# Patient Record
Sex: Female | Born: 1985 | Race: Black or African American | Hispanic: No | Marital: Married | Smoking: Never smoker
Health system: Southern US, Community
[De-identification: ages and names within clinical notes are randomized; demographics above are authoritative.]

## PROBLEM LIST (undated history)

## (undated) DIAGNOSIS — Z973 Presence of spectacles and contact lenses: Secondary | ICD-10-CM

## (undated) DIAGNOSIS — K760 Fatty (change of) liver, not elsewhere classified: Secondary | ICD-10-CM

## (undated) DIAGNOSIS — Z8489 Family history of other specified conditions: Secondary | ICD-10-CM

## (undated) DIAGNOSIS — J3089 Other allergic rhinitis: Secondary | ICD-10-CM

## (undated) DIAGNOSIS — J40 Bronchitis, not specified as acute or chronic: Secondary | ICD-10-CM

## (undated) DIAGNOSIS — M67819 Other specified disorders of synovium and tendon, unspecified shoulder: Secondary | ICD-10-CM

## (undated) DIAGNOSIS — J449 Chronic obstructive pulmonary disease, unspecified: Secondary | ICD-10-CM

## (undated) DIAGNOSIS — M19011 Primary osteoarthritis, right shoulder: Secondary | ICD-10-CM

## (undated) DIAGNOSIS — Z87898 Personal history of other specified conditions: Secondary | ICD-10-CM

## (undated) DIAGNOSIS — F209 Schizophrenia, unspecified: Secondary | ICD-10-CM

## (undated) DIAGNOSIS — K529 Noninfective gastroenteritis and colitis, unspecified: Secondary | ICD-10-CM

## (undated) DIAGNOSIS — F32A Depression, unspecified: Secondary | ICD-10-CM

## (undated) DIAGNOSIS — F4310 Post-traumatic stress disorder, unspecified: Secondary | ICD-10-CM

## (undated) DIAGNOSIS — J45909 Unspecified asthma, uncomplicated: Secondary | ICD-10-CM

## (undated) DIAGNOSIS — F329 Major depressive disorder, single episode, unspecified: Secondary | ICD-10-CM

## (undated) HISTORY — PX: COLONOSCOPY: SHX174

## (undated) HISTORY — PX: CHOLECYSTECTOMY: SHX55

## (undated) HISTORY — PX: WISDOM TOOTH EXTRACTION: SHX21

---

## 2008-02-03 ENCOUNTER — Emergency Department (HOSPITAL_COMMUNITY): Admission: EM | Admit: 2008-02-03 | Discharge: 2008-02-03 | Payer: Self-pay | Admitting: Emergency Medicine
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
## 2013-02-01 DIAGNOSIS — N979 Female infertility, unspecified: Secondary | ICD-10-CM | POA: Insufficient documentation

## 2013-08-09 DIAGNOSIS — F603 Borderline personality disorder: Secondary | ICD-10-CM | POA: Insufficient documentation

## 2013-08-09 DIAGNOSIS — F4481 Dissociative identity disorder: Secondary | ICD-10-CM | POA: Insufficient documentation

## 2014-10-01 DIAGNOSIS — F4310 Post-traumatic stress disorder, unspecified: Secondary | ICD-10-CM | POA: Insufficient documentation

## 2017-02-14 DIAGNOSIS — F250 Schizoaffective disorder, bipolar type: Secondary | ICD-10-CM | POA: Insufficient documentation

## 2017-08-17 DIAGNOSIS — K760 Fatty (change of) liver, not elsewhere classified: Secondary | ICD-10-CM | POA: Insufficient documentation

## 2018-11-08 DIAGNOSIS — F332 Major depressive disorder, recurrent severe without psychotic features: Secondary | ICD-10-CM | POA: Insufficient documentation

## 2018-11-08 DIAGNOSIS — L680 Hirsutism: Secondary | ICD-10-CM | POA: Insufficient documentation

## 2018-11-18 DIAGNOSIS — B159 Hepatitis A without hepatic coma: Secondary | ICD-10-CM | POA: Insufficient documentation

## 2019-01-23 ENCOUNTER — Other Ambulatory Visit: Payer: Self-pay | Admitting: Orthopedic Surgery

## 2019-01-30 ENCOUNTER — Encounter (HOSPITAL_COMMUNITY): Payer: Self-pay | Admitting: *Deleted

## 2019-01-30 ENCOUNTER — Other Ambulatory Visit: Payer: Self-pay

## 2019-01-30 NOTE — Progress Notes (Signed)
Pt denies any acute pulmonary issues. Pt denies chest pain and being under the care of a cardiologist. Pt denies having a stress test and cardiac cath but stated that an echo and chest x ray were done last year; records requested. Pt denies having an EKG within the last year. Pt denies recent labs. Pt made aware to stop taking Aspirin ( unless advised otherwise by surgeon), vitamins, fish oil and herbal medications. Do not take any NSAIDs ie: Ibuprofen, Advil, Naproxen (Aleve), Motrin, BC and Goody Powder or any medication containing Aspirin. Pt verbalized understanding of all pre-op instructions.

## 2019-01-31 ENCOUNTER — Encounter (HOSPITAL_COMMUNITY): Payer: Self-pay

## 2019-01-31 ENCOUNTER — Ambulatory Visit (HOSPITAL_COMMUNITY)
Admission: RE | Admit: 2019-01-31 | Discharge: 2019-01-31 | Disposition: A | Discharge status: Home / Self Care | Payer: No Typology Code available for payment source | Attending: Orthopedic Surgery | Admitting: Orthopedic Surgery

## 2019-01-31 ENCOUNTER — Ambulatory Visit (HOSPITAL_COMMUNITY): Payer: No Typology Code available for payment source | Admitting: Anesthesiology

## 2019-01-31 ENCOUNTER — Encounter (HOSPITAL_COMMUNITY)
Admission: RE | Disposition: A | Discharge status: Home / Self Care | Payer: Self-pay | Source: Home / Self Care | Attending: Orthopedic Surgery

## 2019-01-31 DIAGNOSIS — F4310 Post-traumatic stress disorder, unspecified: Secondary | ICD-10-CM | POA: Diagnosis not present

## 2019-01-31 DIAGNOSIS — Z9049 Acquired absence of other specified parts of digestive tract: Secondary | ICD-10-CM | POA: Diagnosis not present

## 2019-01-31 DIAGNOSIS — Z6841 Body Mass Index (BMI) 40.0 and over, adult: Secondary | ICD-10-CM | POA: Insufficient documentation

## 2019-01-31 DIAGNOSIS — Z7951 Long term (current) use of inhaled steroids: Secondary | ICD-10-CM | POA: Insufficient documentation

## 2019-01-31 DIAGNOSIS — M19011 Primary osteoarthritis, right shoulder: Secondary | ICD-10-CM | POA: Diagnosis present

## 2019-01-31 DIAGNOSIS — Z885 Allergy status to narcotic agent status: Secondary | ICD-10-CM | POA: Insufficient documentation

## 2019-01-31 DIAGNOSIS — M659 Synovitis and tenosynovitis, unspecified: Secondary | ICD-10-CM | POA: Diagnosis not present

## 2019-01-31 DIAGNOSIS — M719 Bursopathy, unspecified: Secondary | ICD-10-CM | POA: Diagnosis not present

## 2019-01-31 DIAGNOSIS — K760 Fatty (change of) liver, not elsewhere classified: Secondary | ICD-10-CM | POA: Diagnosis not present

## 2019-01-31 DIAGNOSIS — Z91041 Radiographic dye allergy status: Secondary | ICD-10-CM | POA: Insufficient documentation

## 2019-01-31 DIAGNOSIS — M7541 Impingement syndrome of right shoulder: Secondary | ICD-10-CM | POA: Diagnosis not present

## 2019-01-31 DIAGNOSIS — Z880 Allergy status to penicillin: Secondary | ICD-10-CM | POA: Diagnosis not present

## 2019-01-31 DIAGNOSIS — Y990 Civilian activity done for income or pay: Secondary | ICD-10-CM | POA: Diagnosis not present

## 2019-01-31 DIAGNOSIS — F209 Schizophrenia, unspecified: Secondary | ICD-10-CM | POA: Diagnosis not present

## 2019-01-31 DIAGNOSIS — J449 Chronic obstructive pulmonary disease, unspecified: Secondary | ICD-10-CM | POA: Insufficient documentation

## 2019-01-31 DIAGNOSIS — F329 Major depressive disorder, single episode, unspecified: Secondary | ICD-10-CM | POA: Diagnosis not present

## 2019-01-31 DIAGNOSIS — K529 Noninfective gastroenteritis and colitis, unspecified: Secondary | ICD-10-CM | POA: Insufficient documentation

## 2019-01-31 DIAGNOSIS — M24811 Other specific joint derangements of right shoulder, not elsewhere classified: Secondary | ICD-10-CM | POA: Diagnosis not present

## 2019-01-31 DIAGNOSIS — Z888 Allergy status to other drugs, medicaments and biological substances status: Secondary | ICD-10-CM | POA: Insufficient documentation

## 2019-01-31 DIAGNOSIS — K219 Gastro-esophageal reflux disease without esophagitis: Secondary | ICD-10-CM | POA: Insufficient documentation

## 2019-01-31 DIAGNOSIS — Z91018 Allergy to other foods: Secondary | ICD-10-CM | POA: Insufficient documentation

## 2019-01-31 DIAGNOSIS — Z79899 Other long term (current) drug therapy: Secondary | ICD-10-CM | POA: Insufficient documentation

## 2019-01-31 HISTORY — DX: Unspecified asthma, uncomplicated: J45.909

## 2019-01-31 HISTORY — DX: Depression, unspecified: F32.A

## 2019-01-31 HISTORY — DX: Schizophrenia, unspecified: F20.9

## 2019-01-31 HISTORY — DX: Primary osteoarthritis, right shoulder: M19.011

## 2019-01-31 HISTORY — DX: Other allergic rhinitis: J30.89

## 2019-01-31 HISTORY — DX: Noninfective gastroenteritis and colitis, unspecified: K52.9

## 2019-01-31 HISTORY — PX: SHOULDER ARTHROSCOPY: SHX128

## 2019-01-31 HISTORY — DX: Post-traumatic stress disorder, unspecified: F43.10

## 2019-01-31 HISTORY — DX: Other specified disorders of synovium and tendon, unspecified shoulder: M67.819

## 2019-01-31 HISTORY — DX: Major depressive disorder, single episode, unspecified: F32.9

## 2019-01-31 HISTORY — DX: Bronchitis, not specified as acute or chronic: J40

## 2019-01-31 HISTORY — DX: Presence of spectacles and contact lenses: Z97.3

## 2019-01-31 HISTORY — DX: Morbid (severe) obesity due to excess calories: E66.01

## 2019-01-31 HISTORY — DX: Personal history of other specified conditions: Z87.898

## 2019-01-31 HISTORY — DX: Family history of other specified conditions: Z84.89

## 2019-01-31 HISTORY — DX: Chronic obstructive pulmonary disease, unspecified: J44.9

## 2019-01-31 HISTORY — DX: Fatty (change of) liver, not elsewhere classified: K76.0

## 2019-01-31 LAB — CBC
HCT: 42.5 % (ref 36.0–46.0)
Hemoglobin: 13.8 g/dL (ref 12.0–15.0)
MCH: 30.2 pg (ref 26.0–34.0)
MCHC: 32.5 g/dL (ref 30.0–36.0)
MCV: 93 fL (ref 80.0–100.0)
NRBC: 0 % (ref 0.0–0.2)
Platelets: 234 10*3/uL (ref 150–400)
RBC: 4.57 MIL/uL (ref 3.87–5.11)
RDW: 13.4 % (ref 11.5–15.5)
WBC: 4.2 10*3/uL (ref 4.0–10.5)

## 2019-01-31 LAB — COMPREHENSIVE METABOLIC PANEL
ALBUMIN: 3.7 g/dL (ref 3.5–5.0)
ALT: 14 U/L (ref 0–44)
AST: 17 U/L (ref 15–41)
Alkaline Phosphatase: 51 U/L (ref 38–126)
Anion gap: 14 (ref 5–15)
BUN: 10 mg/dL (ref 6–20)
CO2: 18 mmol/L — ABNORMAL LOW (ref 22–32)
CREATININE: 0.79 mg/dL (ref 0.44–1.00)
Calcium: 9.1 mg/dL (ref 8.9–10.3)
Chloride: 107 mmol/L (ref 98–111)
GFR calc Af Amer: >60 mL/min (ref >60)
GFR calc non Af Amer: >60 mL/min (ref >60)
Glucose, Bld: 93 mg/dL (ref 70–99)
Potassium: 4.1 mmol/L (ref 3.5–5.1)
Sodium: 139 mmol/L (ref 135–145)
Total Bilirubin: 0.8 mg/dL (ref 0.3–1.2)
Total Protein: 6.8 g/dL (ref 6.5–8.1)

## 2019-01-31 LAB — POCT PREGNANCY, URINE: Preg Test, Ur: NEGATIVE

## 2019-01-31 SURGERY — ARTHROSCOPY, SHOULDER
Anesthesia: General | Site: Shoulder | Laterality: Right

## 2019-01-31 MED ORDER — DEXAMETHASONE SODIUM PHOSPHATE 10 MG/ML IJ SOLN
INTRAMUSCULAR | Status: AC
  Filled 2019-01-31: qty 3

## 2019-01-31 MED ORDER — BUPIVACAINE-EPINEPHRINE 0.5% -1:200000 IJ SOLN
INTRAMUSCULAR | Status: AC
  Filled 2019-01-31: qty 1

## 2019-01-31 MED ORDER — ONDANSETRON HCL 4 MG/2ML IJ SOLN
INTRAMUSCULAR | Status: AC
  Filled 2019-01-31: qty 6

## 2019-01-31 MED ORDER — SODIUM CHLORIDE 0.9 % IR SOLN
Status: DC | PRN
  Administered 2019-01-31: 3000 mL

## 2019-01-31 MED ORDER — DEXAMETHASONE SODIUM PHOSPHATE 10 MG/ML IJ SOLN
INTRAMUSCULAR | Status: DC | PRN
  Administered 2019-01-31: 10 mg via INTRAVENOUS

## 2019-01-31 MED ORDER — LIDOCAINE 2% (20 MG/ML) 5 ML SYRINGE
INTRAMUSCULAR | Status: AC
  Filled 2019-01-31: qty 5

## 2019-01-31 MED ORDER — FENTANYL CITRATE (PF) 100 MCG/2ML IJ SOLN: 25.0000 ug | INTRAMUSCULAR | Status: DC | PRN

## 2019-01-31 MED ORDER — BUPIVACAINE LIPOSOME 1.3 % IJ SUSP
INTRAMUSCULAR | Status: DC | PRN
  Administered 2019-01-31: 10 mL

## 2019-01-31 MED ORDER — MIDAZOLAM HCL 2 MG/2ML IJ SOLN
INTRAMUSCULAR | Status: AC
  Filled 2019-01-31: qty 2

## 2019-01-31 MED ORDER — ONDANSETRON HCL 4 MG PO TABS: 4.0000 mg | ORAL_TABLET | Freq: Four times a day (QID) | ORAL | Status: DC | PRN

## 2019-01-31 MED ORDER — ONDANSETRON HCL 4 MG/2ML IJ SOLN: 4.0000 mg | Freq: Four times a day (QID) | INTRAMUSCULAR | Status: DC | PRN

## 2019-01-31 MED ORDER — MIDAZOLAM HCL 5 MG/5ML IJ SOLN
INTRAMUSCULAR | Status: DC | PRN
  Administered 2019-01-31: 2 mg via INTRAVENOUS

## 2019-01-31 MED ORDER — FENTANYL CITRATE (PF) 250 MCG/5ML IJ SOLN
INTRAMUSCULAR | Status: AC
  Filled 2019-01-31: qty 5

## 2019-01-31 MED ORDER — PROPOFOL 10 MG/ML IV BOLUS
INTRAVENOUS | Status: DC | PRN
  Administered 2019-01-31: 200 mg via INTRAVENOUS

## 2019-01-31 MED ORDER — MIDAZOLAM HCL 2 MG/2ML IJ SOLN
2.0000 mg | Freq: Once | INTRAMUSCULAR | Status: AC
  Administered 2019-01-31: 2 mg via INTRAVENOUS

## 2019-01-31 MED ORDER — BUPIVACAINE HCL (PF) 0.5 % IJ SOLN
INTRAMUSCULAR | Status: DC | PRN
  Administered 2019-01-31: 15 mL via PERINEURAL

## 2019-01-31 MED ORDER — METOCLOPRAMIDE HCL 5 MG/ML IJ SOLN: 5.0000 mg | Freq: Three times a day (TID) | INTRAMUSCULAR | Status: DC | PRN

## 2019-01-31 MED ORDER — PHENYLEPHRINE HCL 10 MG/ML IJ SOLN
INTRAMUSCULAR | Status: DC | PRN
  Administered 2019-01-31 (×2): 120 ug via INTRAVENOUS

## 2019-01-31 MED ORDER — VANCOMYCIN HCL 10 G IV SOLR
1500.0000 mg | INTRAVENOUS | Status: AC
  Administered 2019-01-31: 1500 mg via INTRAVENOUS
  Filled 2019-01-31: qty 1500

## 2019-01-31 MED ORDER — OXYCODONE HCL 5 MG/5ML PO SOLN: 5.0000 mg | Freq: Once | ORAL | Status: DC | PRN

## 2019-01-31 MED ORDER — FENTANYL CITRATE (PF) 100 MCG/2ML IJ SOLN
50.0000 ug | Freq: Once | INTRAMUSCULAR | Status: AC
  Administered 2019-01-31: 50 ug via INTRAVENOUS

## 2019-01-31 MED ORDER — FENTANYL CITRATE (PF) 250 MCG/5ML IJ SOLN
INTRAMUSCULAR | Status: DC | PRN
  Administered 2019-01-31: 50 ug via INTRAVENOUS
  Administered 2019-01-31: 100 ug via INTRAVENOUS

## 2019-01-31 MED ORDER — ROCURONIUM BROMIDE 50 MG/5ML IV SOSY
PREFILLED_SYRINGE | INTRAVENOUS | Status: AC
  Filled 2019-01-31: qty 20

## 2019-01-31 MED ORDER — ONDANSETRON HCL 4 MG/2ML IJ SOLN
INTRAMUSCULAR | Status: DC | PRN
  Administered 2019-01-31: 4 mg via INTRAVENOUS

## 2019-01-31 MED ORDER — DEXAMETHASONE SODIUM PHOSPHATE 10 MG/ML IJ SOLN
INTRAMUSCULAR | Status: AC
  Filled 2019-01-31: qty 1

## 2019-01-31 MED ORDER — ONDANSETRON HCL 4 MG/2ML IJ SOLN
INTRAMUSCULAR | Status: AC
  Filled 2019-01-31: qty 2

## 2019-01-31 MED ORDER — PROPOFOL 10 MG/ML IV BOLUS
INTRAVENOUS | Status: AC
  Filled 2019-01-31: qty 20

## 2019-01-31 MED ORDER — OXYCODONE-ACETAMINOPHEN 5-325 MG PO TABS: 1.0000 | ORAL_TABLET | ORAL | Status: DC | PRN

## 2019-01-31 MED ORDER — MIDAZOLAM HCL 2 MG/2ML IJ SOLN
INTRAMUSCULAR | Status: AC
  Administered 2019-01-31: 2 mg via INTRAVENOUS
  Filled 2019-01-31: qty 2

## 2019-01-31 MED ORDER — SUGAMMADEX SODIUM 200 MG/2ML IV SOLN
INTRAVENOUS | Status: DC | PRN
  Administered 2019-01-31: 200 mg via INTRAVENOUS

## 2019-01-31 MED ORDER — SUGAMMADEX SODIUM 500 MG/5ML IV SOLN
INTRAVENOUS | Status: AC
  Filled 2019-01-31: qty 5

## 2019-01-31 MED ORDER — CHLORHEXIDINE GLUCONATE 4 % EX LIQD: 60.0000 mL | Freq: Once | CUTANEOUS | Status: DC

## 2019-01-31 MED ORDER — OXYCODONE-ACETAMINOPHEN 5-325 MG PO TABS: 1.0000 | ORAL_TABLET | ORAL | 0 refills | Status: DC | PRN

## 2019-01-31 MED ORDER — OXYCODONE HCL 5 MG PO TABS: 5.0000 mg | ORAL_TABLET | Freq: Once | ORAL | Status: DC | PRN

## 2019-01-31 MED ORDER — DIPHENHYDRAMINE HCL 50 MG/ML IJ SOLN
INTRAMUSCULAR | Status: AC
  Filled 2019-01-31: qty 1

## 2019-01-31 MED ORDER — ROCURONIUM BROMIDE 100 MG/10ML IV SOLN
INTRAVENOUS | Status: DC | PRN
  Administered 2019-01-31: 25 mg via INTRAVENOUS
  Administered 2019-01-31: 50 mg via INTRAVENOUS

## 2019-01-31 MED ORDER — METOCLOPRAMIDE HCL 5 MG PO TABS: 5.0000 mg | ORAL_TABLET | Freq: Three times a day (TID) | ORAL | Status: DC | PRN

## 2019-01-31 MED ORDER — SODIUM CHLORIDE 0.9 % IV SOLN
INTRAVENOUS | Status: DC | PRN
  Administered 2019-01-31: 25 ug/min via INTRAVENOUS

## 2019-01-31 MED ORDER — ONDANSETRON HCL 4 MG/2ML IJ SOLN: 4.0000 mg | Freq: Once | INTRAMUSCULAR | Status: DC | PRN

## 2019-01-31 MED ORDER — GLYCOPYRROLATE PF 0.2 MG/ML IJ SOSY
PREFILLED_SYRINGE | INTRAMUSCULAR | Status: AC
  Filled 2019-01-31: qty 1

## 2019-01-31 MED ORDER — ROCURONIUM BROMIDE 50 MG/5ML IV SOSY
PREFILLED_SYRINGE | INTRAVENOUS | Status: AC
  Filled 2019-01-31: qty 5

## 2019-01-31 MED ORDER — LACTATED RINGERS IV SOLN
INTRAVENOUS | Status: DC
  Administered 2019-01-31 (×2): via INTRAVENOUS

## 2019-01-31 MED ORDER — FENTANYL CITRATE (PF) 100 MCG/2ML IJ SOLN
INTRAMUSCULAR | Status: AC
  Administered 2019-01-31: 50 ug via INTRAVENOUS
  Filled 2019-01-31: qty 2

## 2019-01-31 SURGICAL SUPPLY — 56 items
BLADE SURG 11 STRL SS (BLADE) ×3 IMPLANT
BUR OVAL 4.0 (BURR) IMPLANT
BURR OVAL 8 FLU 4.0MM X 13CM (MISCELLANEOUS) ×1
BURR OVAL 8 FLU 4.0X13 (MISCELLANEOUS) ×2 IMPLANT
CANISTER OMNI JUG 16 LITER (MISCELLANEOUS) IMPLANT
CANNULA 5.75X71 LONG (CANNULA) ×3 IMPLANT
CANNULA TWIST IN 8.25X7CM (CANNULA) IMPLANT
CHLORAPREP W/TINT 26ML (MISCELLANEOUS) ×3 IMPLANT
COVER WAND RF STERILE (DRAPES) ×3 IMPLANT
CUTTER BONE 4.0MM X 13CM (MISCELLANEOUS) ×3 IMPLANT
DRAPE IMP U-DRAPE 54X76 (DRAPES) ×3 IMPLANT
DRAPE INCISE IOBAN 66X45 STRL (DRAPES) ×3 IMPLANT
DRAPE ORTHO SPLIT 77X108 STRL (DRAPES) ×4
DRAPE STERI 35X30 U-POUCH (DRAPES) ×3 IMPLANT
DRAPE SURG 17X23 STRL (DRAPES) ×3 IMPLANT
DRAPE SURG ORHT 6 SPLT 77X108 (DRAPES) ×2 IMPLANT
DRAPE U-SHAPE 47X51 STRL (DRAPES) ×3 IMPLANT
DRSG PAD ABDOMINAL 8X10 ST (GAUZE/BANDAGES/DRESSINGS) ×9 IMPLANT
GAUZE SPONGE 4X4 12PLY STRL (GAUZE/BANDAGES/DRESSINGS) ×3 IMPLANT
GAUZE XEROFORM 1X8 LF (GAUZE/BANDAGES/DRESSINGS) ×3 IMPLANT
GLOVE BIO SURGEON STRL SZ7 (GLOVE) ×6 IMPLANT
GLOVE BIO SURGEON STRL SZ7.5 (GLOVE) ×3 IMPLANT
GLOVE BIOGEL PI IND STRL 7.0 (GLOVE) ×1 IMPLANT
GLOVE BIOGEL PI IND STRL 8 (GLOVE) ×1 IMPLANT
GLOVE BIOGEL PI INDICATOR 7.0 (GLOVE) ×2
GLOVE BIOGEL PI INDICATOR 8 (GLOVE) ×2
GOWN STRL REUS W/ TWL LRG LVL3 (GOWN DISPOSABLE) ×2 IMPLANT
GOWN STRL REUS W/TWL LRG LVL3 (GOWN DISPOSABLE) ×4
KIT BASIN OR (CUSTOM PROCEDURE TRAY) ×3 IMPLANT
KIT TURNOVER KIT B (KITS) ×3 IMPLANT
MANIFOLD NEPTUNE II (INSTRUMENTS) ×3 IMPLANT
NEEDLE SPNL 18GX3.5 QUINCKE PK (NEEDLE) ×3 IMPLANT
NS IRRIG 1000ML POUR BTL (IV SOLUTION) ×3 IMPLANT
PACK SHOULDER (CUSTOM PROCEDURE TRAY) ×3 IMPLANT
PACK UNIVERSAL I (CUSTOM PROCEDURE TRAY) ×3 IMPLANT
PAD ABD 8X10 STRL (GAUZE/BANDAGES/DRESSINGS) ×3 IMPLANT
PAD ARMBOARD 7.5X6 YLW CONV (MISCELLANEOUS) ×6 IMPLANT
PROBE BIPOLAR ATHRO 135MM 90D (MISCELLANEOUS) ×3 IMPLANT
RESECTOR FULL RADIUS 4.2MM (BLADE) ×3 IMPLANT
SET ARTHROSCOPY TUBING (MISCELLANEOUS) ×2
SET ARTHROSCOPY TUBING LN (MISCELLANEOUS) ×1 IMPLANT
SLING ARM FOAM STRAP LRG (SOFTGOODS) ×3 IMPLANT
SLING ARM FOAM STRAP MED (SOFTGOODS) IMPLANT
SPONGE LAP 4X18 RFD (DISPOSABLE) IMPLANT
SUPPORT WRAP ARM LG (MISCELLANEOUS) ×3 IMPLANT
SUT ETHILON 3 0 PS 1 (SUTURE) ×3 IMPLANT
SUTURE TAPE FIBERLINK 1.3 LOOP (SUTURE) IMPLANT
SUTURETAPE FIBERLINK 1.3 LOOP (SUTURE)
TAPE CLOTH SURG 6X10 WHT LF (GAUZE/BANDAGES/DRESSINGS) ×3 IMPLANT
TOWEL OR 17X24 6PK STRL BLUE (TOWEL DISPOSABLE) ×3 IMPLANT
TOWEL OR 17X26 10 PK STRL BLUE (TOWEL DISPOSABLE) ×3 IMPLANT
TUBE CONNECTING 12'X1/4 (SUCTIONS) ×1
TUBE CONNECTING 12X1/4 (SUCTIONS) ×2 IMPLANT
TUBING ARTHROSCOPY IRRIG 16FT (MISCELLANEOUS) ×3 IMPLANT
WAND HAND CNTRL MULTIVAC 90 (MISCELLANEOUS) IMPLANT
WATER STERILE IRR 1000ML POUR (IV SOLUTION) ×3 IMPLANT

## 2019-01-31 NOTE — Progress Notes (Addendum)
Per Dr. Clemens Catholic, order complete set of labs: CBC, CMP

## 2019-01-31 NOTE — Anesthesia Preprocedure Evaluation (Addendum)
Anesthesia Evaluation  Patient identified by MRN, date of birth, ID band Patient awake    Reviewed: Allergy & Precautions, NPO status , Patient's Chart, lab work & pertinent test results  History of Anesthesia Complications Negative for: history of anesthetic complications  Airway Mallampati: II  TM Distance: >3 FB Neck ROM: Full    Dental no notable dental hx. (+) Teeth Intact, Dental Advisory Given   Pulmonary asthma , COPD,    Pulmonary exam normal        Cardiovascular negative cardio ROS Normal cardiovascular exam     Neuro/Psych negative neurological ROS  negative psych ROS   GI/Hepatic negative GI ROS, Neg liver ROS,   Endo/Other  Morbid obesity  Renal/GU negative Renal ROS  negative genitourinary   Musculoskeletal negative musculoskeletal ROS (+)   Abdominal (+) + obese,   Peds  Hematology negative hematology ROS (+)   Anesthesia Other Findings 33 yo F - asthma, schizoaffective disorder, PTSD, GERD, BMI 54  Reproductive/Obstetrics                          Anesthesia Physical Anesthesia Plan  ASA: III  Anesthesia Plan: General   Post-op Pain Management: GA combined w/ Regional for post-op pain   Induction: Intravenous  PONV Risk Score and Plan: 3 and Ondansetron, Dexamethasone, Midazolam and Treatment may vary due to age or medical condition  Airway Management Planned: Oral ETT  Additional Equipment: None  Intra-op Plan:   Post-operative Plan: Extubation in OR  Informed Consent: I have reviewed the patients History and Physical, chart, labs and discussed the procedure including the risks, benefits and alternatives for the proposed anesthesia with the patient or authorized representative who has indicated his/her understanding and acceptance.     Dental advisory given  Plan Discussed with:   Anesthesia Plan Comments:        Anesthesia Quick Evaluation

## 2019-01-31 NOTE — Transfer of Care (Signed)
Immediate Anesthesia Transfer of Care Note  Patient: Michelle Strickland  Procedure(s) Performed: RIGHT SHOULDER ARTHROSCOPIC DEBRIDEMENT ROTATOR CUFF; SUBACROMIAL DECOMPRESSION; DISTAL CLAVICLE EXCISION (Right Shoulder)  Patient Location: PACU  Anesthesia Type:GA combined with regional for post-op pain  Level of Consciousness: awake, alert  and oriented  Airway & Oxygen Therapy: Patient Spontanous Breathing and Patient connected to nasal cannula oxygen  Post-op Assessment: Report given to RN, Post -op Vital signs reviewed and stable and Patient moving all extremities X 4  Post vital signs: Reviewed and stable  Last Vitals:  Vitals Value Taken Time  BP 107/65 01/31/2019  2:28 PM  Temp    Pulse 70 01/31/2019  2:29 PM  Resp 27 01/31/2019  2:29 PM  SpO2 93 % 01/31/2019  2:29 PM  Vitals shown include unvalidated device data.  Last Pain:  Vitals:   01/31/19 1053  TempSrc:   PainSc: 6       Patients Stated Pain Goal: 2 (01/31/19 1053)  Complications: No apparent anesthesia complications

## 2019-01-31 NOTE — Op Note (Signed)
Procedure(s): Procedure Note  SHAYLAN MISQUEZ female 33 y.o. 01/31/2019  Preoperative diagnosis: #1 anterior right shoulder pain #2 right shoulder impingement with unfavorable acromial anatomy 3.  Right shoulder AC joint internal derangement  Postoperative diagnosis:  #1 chronic biceps tenosynovitis #2 right shoulder impingement with unfavorable acromial anatomy 3.  Right shoulder AC joint internal derangement  Procedure(s) and Anesthesia Type:    #1 RIGHT SHOULDER ARTHROSCOPIC subacromial decompression, debridement partial bursal fraying rotator cuff   #2 right shoulder arthroscopic distal clavicle excision #3 right shoulder proximal long head biceps tenotomy  Surgeon(s) and Role:    Jones Broom, MD - Primary     Surgeon: Berline Lopes   Assistants: Dannielle Burn, PA-C, present and scrubbed throughout the procedure, essential for assistance with camera work , positioning and closure  Anesthesia: General endotracheal anesthesia with preoperative interscalene block given by the attending anesthesiologist     Procedure Detail   Estimated Blood Loss: Min         Drains: none  Blood Given: none         Specimens: none        Complications:  * No complications entered in OR log *         Disposition: PACU - hemodynamically stable.         Condition: stable    Procedure:   INDICATIONS FOR SURGERY: The patient is 33 y.o. female who has had a long history of right shoulder pain since an injury at work.  She has failed extensive conservative management with injections, physical therapy, rest and medications.  Indicated for surgical treatment to try and decrease pain and restore function.  OPERATIVE FINDINGS: Examination under anesthesia: No stiffness or instability   DESCRIPTION OF PROCEDURE: The patient was identified in preoperative  holding area where I personally marked the operative site after  verifying site, side, and procedure with the  patient. An interscalene block was given by the attending anesthesiologist the holding area.  The patient was taken back to the operating room where general anesthesia was induced without complication and was placed in the beach-chair position with the back  elevated about 60 degrees and all extremities and head and neck carefully padded and  positioned.   The right upper extremity was then prepped and  draped in a standard sterile fashion. The appropriate time-out  procedure was carried out. The patient did receive IV antibiotics  within 30 minutes of incision.   A small posterior portal incision was made and the arthroscope was introduced into the joint. An anterior portal was then established above the subscapularis using needle localization. Small cannula was placed anteriorly. Diagnostic arthroscopy was then carried out with .  The subscapularis was noted to be completely intact.  Glenohumeral joint surfaces were intact without any chondromalacia.  The undersurface of the supraspinatus looked pristine.  The biceps anchor had mild fraying which was debrided but no detachment.  The biceps was pulled into the joint and the portion of the biceps that was extra-articular was noted to have severe tenosynovitis.  This was felt to be 1 of the sources of this patient's pain given her continued anterior pain.  Therefore using curved Mayo scissors through a small anterior incision the biceps was released from the superior labrum allowed to retract from the joint to alleviate the pain associated with her long head biceps chronic tenosynovitis.  The arthroscope was then introduced into the subacromial space a standard lateral portal was established with needle localization.  The shaver was used through the lateral portal to perform extensive bursectomy. Coracoacromial ligament was examined and found to be frayed indicating impingement.  She was noted to have severe bursitis in the space.  Once this was  debrided back to the bursal side of the rotator cuff there was 1 small flap tear which was debrided back to healthy tendon.  Otherwise the rotator cuff looked intact.  The coracoacromial ligament was taken down off the anterior acromion with the ArthroCare exposing a moderate anterior acromial spur. A high-speed bur was then used through the lateral portal to take down the anterior acromial spur from lateral to medial in a standard acromioplasty.  The acromioplasty was also viewed from the lateral portal and the bur was used as necessary to ensure that the acromion was completely flat from posterior to anterior.  The distal clavicle was exposed arthroscopically and the bur was used to take off the undersurface for approximately 8 mm from the lateral portal. The bur was then moved to an anterior portal position to complete the distal clavicle excision resecting about 8 mm of the distal clavicle and a smooth even fashion. This was viewed from anterior and lateral portals and felt to be complete.  The arthroscopic equipment was removed from the joint and the portals were closed with 3-0 nylon in an interrupted fashion. Sterile dressings were then applied including Xeroform 4 x 4's ABDs and tape. The patient was then allowed to awaken from general anesthesia, placed in a sling, transferred to the stretcher and taken to the recovery room in stable condition.   POSTOPERATIVE PLAN: The patient will be discharged home today and will followup in one week for suture removal and wound check.

## 2019-01-31 NOTE — H&P (Signed)
Michelle Strickland is an 33 y.o. female.   Chief Complaint: Right shoulder pain after an injury at work HPI: The patient is a 33 year old female with many months of right shoulder pain since an injury at work 04/24/2018 which has been refractory to nonoperative management with physical therapy injections and medications.  She was indicated for arthroscopy partially for diagnostic purposes and to proceed with distal clavicle excision and subacromial decompression which were felt to be part of her symptom complex.  Past Medical History:  Diagnosis Date  . Asthma   . Bronchitis   . Chronic diarrhea   . COPD (chronic obstructive pulmonary disease) (HCC)   . Depression   . Environmental and seasonal allergies   . Family history of adverse reaction to anesthesia    " My sister vomited and aspirated during surgery; they gave her too much anesthesia"  . Fatty liver   . H/O heartburn   . Morbid obesity (HCC)   . Osteoarthritis of right acromioclavicular joint   . PTSD (post-traumatic stress disorder)   . Schizophrenia (HCC)   . Tendinosis of rotator cuff    impingement  . Wears glasses     Past Surgical History:  Procedure Laterality Date  . CHOLECYSTECTOMY    . COLONOSCOPY    . WISDOM TOOTH EXTRACTION      History reviewed. No pertinent family history. Social History:  reports that she has never smoked. She has never used smokeless tobacco. She reports current alcohol use. She reports previous drug use.  Allergies:  Allergies  Allergen Reactions  . Contrast Media [Iodinated Diagnostic Agents] Nausea And Vomiting  . Penicillins Hives    Did it involve swelling of the face/tongue/throat, SOB, or low BP? No Did it involve sudden or severe rash/hives, skin peeling, or any reaction on the inside of your mouth or nose? Yes Did you need to seek medical attention at a hospital or doctor's office? Yes When did it last happen?Childhood allergy If all above answers are "NO", may  proceed with cephalosporin use.   . Toradol [Ketorolac Tromethamine]     migraines  . Tramadol     migraines  . Watermelon [Citrullus Vulgaris]     Throat itches    Medications Prior to Admission  Medication Sig Dispense Refill  . acetaminophen (TYLENOL) 500 MG tablet Take 1,000 mg by mouth daily as needed for moderate pain or headache.    . albuterol (PROVENTIL HFA;VENTOLIN HFA) 108 (90 Base) MCG/ACT inhaler Inhale 2 puffs into the lungs every 6 (six) hours as needed for wheezing or shortness of breath.    . budesonide-formoterol (SYMBICORT) 160-4.5 MCG/ACT inhaler Inhale 2 puffs into the lungs 2 (two) times daily.    . ranitidine (ZANTAC) 150 MG tablet Take 150 mg by mouth daily as needed for heartburn.      Results for orders placed or performed during the hospital encounter of 01/31/19 (from the past 48 hour(s))  CBC     Status: None   Collection Time: 01/31/19 11:01 AM  Result Value Ref Range   WBC 4.2 4.0 - 10.5 K/uL   RBC 4.57 3.87 - 5.11 MIL/uL   Hemoglobin 13.8 12.0 - 15.0 g/dL   HCT 85.8 85.0 - 27.7 %   MCV 93.0 80.0 - 100.0 fL   MCH 30.2 26.0 - 34.0 pg   MCHC 32.5 30.0 - 36.0 g/dL   RDW 41.2 87.8 - 67.6 %   Platelets 234 150 - 400 K/uL   nRBC 0.0 0.0 -  0.2 %    Comment: Performed at [facility], [address], [place], [place] [address]  Comprehensive metabolic panel     Status: Abnormal   Collection Time: 01/31/19 11:01 AM  Result Value Ref Range   Sodium 139 135 - 145 mmol/L   Potassium 4.1 3.5 - 5.1 mmol/L   Chloride 107 98 - 111 mmol/L   CO2 18 (L) 22 - 32 mmol/L   Glucose, Bld 93 70 - 99 mg/dL   BUN 10 6 - 20 mg/dL   Creatinine, Ser 6.04 0.44 - 1.00 mg/dL   Calcium 9.1 8.9 - 54.0 mg/dL   Total Protein 6.8 6.5 - 8.1 g/dL   Albumin 3.7 3.5 - 5.0 g/dL   AST 17 15 - 41 U/L   ALT 14 0 - 44 U/L   Alkaline Phosphatase 51 38 - 126 U/L   Total Bilirubin 0.8 0.3 - 1.2 mg/dL   GFR calc non Af Amer >60 >60 mL/min   GFR calc Af Amer >60 >60 mL/min    Anion gap 14 5 - 15    Comment: Performed at [facility], [address], [place], [place] [address]  Pregnancy, urine POC     Status: None   Collection Time: 01/31/19 11:03 AM  Result Value Ref Range   Preg Test, Ur NEGATIVE NEGATIVE    Comment:        THE SENSITIVITY OF THIS METHODOLOGY IS >24 mIU/mL    No results found.  Review of Systems  All other systems reviewed and are negative.   Blood pressure 121/84, pulse 62, temperature 98.1 F (36.7 C), temperature source Oral, resp. rate 16, height 5\' 6"  (1.676 m), weight (!) 154.2 kg, last menstrual period 01/17/2019, SpO2 99 %. Physical Exam  Constitutional: She is oriented to person, place, and time. She appears well-developed and well-nourished.  HENT:  Head: Atraumatic.  Eyes: EOM are normal.  Cardiovascular: Intact distal pulses.  Respiratory: Effort normal.  Musculoskeletal:     Comments: Right shoulder pain with range of motion, tenderness to palpation over the [facility] joint.  Distally neurovascularly intact.  Neurological: She is alert and oriented to person, place, and time.  Skin: Skin is warm and dry.  Psychiatric: She has a normal mood and affect.     Assessment/Plan The patient is a 33 year old female with many months of right shoulder pain since an injury at work 04/24/2018 which has been refractory to nonoperative management with physical therapy injections and medications.  She was indicated for arthroscopy partially for diagnostic purposes and to proceed with distal clavicle excision and subacromial decompression which were felt to be part of her symptom complex.  Risks / benefits of surgery discussed Consent on chart  NPO for OR Preop antibiotics   Berline Lopes, MD 01/31/2019, 12:13 PM

## 2019-01-31 NOTE — Anesthesia Procedure Notes (Signed)
Procedure Name: Intubation Date/Time: 01/31/2019 1:12 PM Performed by: Marena Chancy, CRNA Pre-anesthesia Checklist: Patient identified, Emergency Drugs available, Suction available and Patient being monitored Patient Re-evaluated:Patient Re-evaluated prior to induction Oxygen Delivery Method: Circle System Utilized Preoxygenation: Pre-oxygenation with 100% oxygen Induction Type: IV induction Ventilation: Mask ventilation without difficulty Laryngoscope Size: Miller and 2 Grade View: Grade I Tube type: Oral Tube size: 7.0 mm Number of attempts: 1 Airway Equipment and Method: Stylet and Oral airway Placement Confirmation: ETT inserted through vocal cords under direct vision,  positive ETCO2 and breath sounds checked- equal and bilateral Tube secured with: Tape Dental Injury: Teeth and Oropharynx as per pre-operative assessment

## 2019-01-31 NOTE — Anesthesia Procedure Notes (Signed)
Anesthesia Regional Block: Interscalene brachial plexus block   Pre-Anesthetic Checklist: ,, timeout performed, Correct Patient, Correct Site, Correct Laterality, Correct Procedure, Correct Position, site marked, Risks and benefits discussed,  Surgical consent,  Pre-op evaluation,  At surgeon's request and post-op pain management  Laterality: Right  Prep: chloraprep       Needles:  Injection technique: Single-shot  Needle Type: Echogenic Stimulator Needle     Needle Length: 9cm  Needle Gauge: 21     Additional Needles:   Procedures:,,,, ultrasound used (permanent image in chart),,,,  Narrative:  Start time: 01/31/2019 12:10 PM End time: 01/31/2019 12:15 PM Injection made incrementally with aspirations every 5 mL.  Performed by: Personally  Anesthesiologist: Lucretia Kern, MD  Additional Notes: Monitors applied. Injection made in 5cc increments. No resistance to injection. Good needle visualization. Patient tolerated procedure well.

## 2019-02-01 ENCOUNTER — Encounter (HOSPITAL_COMMUNITY): Payer: Self-pay | Admitting: Orthopedic Surgery

## 2019-02-10 NOTE — Anesthesia Postprocedure Evaluation (Signed)
Anesthesia Post Note  Patient: Michelle Strickland  Procedure(s) Performed: RIGHT SHOULDER ARTHROSCOPIC DEBRIDEMENT ROTATOR CUFF; SUBACROMIAL DECOMPRESSION; DISTAL CLAVICLE EXCISION (Right Shoulder)     Patient location during evaluation: PACU Anesthesia Type: General Level of consciousness: awake and alert, awake and oriented Pain management: pain level controlled Vital Signs Assessment: post-procedure vital signs reviewed and stable Respiratory status: spontaneous breathing, nonlabored ventilation and respiratory function stable Cardiovascular status: blood pressure returned to baseline and stable Postop Assessment: no apparent nausea or vomiting Anesthetic complications: no    Last Vitals:  Vitals:   01/31/19 1429 01/31/19 1450  BP:  102/65  Pulse:  71  Resp: (!) 24 (!) 29  Temp: 36.6 C   SpO2:  94%    Last Pain:  Vitals:   01/31/19 1450  TempSrc:   PainSc: Asleep                 Cecile Hearing

## 2021-07-21 ENCOUNTER — Ambulatory Visit: Payer: 59 | Admitting: Family Medicine

## 2021-07-21 DIAGNOSIS — J4521 Mild intermittent asthma with (acute) exacerbation: Secondary | ICD-10-CM | POA: Insufficient documentation

## 2021-08-12 ENCOUNTER — Ambulatory Visit: Payer: Self-pay

## 2022-05-19 ENCOUNTER — Ambulatory Visit (HOSPITAL_COMMUNITY)
Admission: EM | Admit: 2022-05-19 | Discharge: 2022-05-20 | Disposition: A | Discharge status: Home / Self Care | Payer: 59 | Attending: Psychiatry | Admitting: Psychiatry

## 2022-05-19 DIAGNOSIS — Z9151 Personal history of suicidal behavior: Secondary | ICD-10-CM | POA: Diagnosis not present

## 2022-05-19 DIAGNOSIS — R45851 Suicidal ideations: Secondary | ICD-10-CM | POA: Diagnosis not present

## 2022-05-19 DIAGNOSIS — F69 Unspecified disorder of adult personality and behavior: Secondary | ICD-10-CM

## 2022-05-19 DIAGNOSIS — R45850 Homicidal ideations: Secondary | ICD-10-CM | POA: Diagnosis present

## 2022-05-19 DIAGNOSIS — F332 Major depressive disorder, recurrent severe without psychotic features: Secondary | ICD-10-CM | POA: Diagnosis not present

## 2022-05-19 DIAGNOSIS — Z20822 Contact with and (suspected) exposure to covid-19: Secondary | ICD-10-CM | POA: Diagnosis not present

## 2022-05-19 LAB — POC SARS CORONAVIRUS 2 AG: SARSCOV2ONAVIRUS 2 AG: NEGATIVE

## 2022-05-19 MED ORDER — MAGNESIUM HYDROXIDE 400 MG/5ML PO SUSP
30.0000 mL | Freq: Every day | ORAL | Status: DC | PRN
Start: 2022-05-19 — End: 2022-05-20

## 2022-05-19 MED ORDER — ALUM & MAG HYDROXIDE-SIMETH 200-200-20 MG/5ML PO SUSP: 30.0000 mL | ORAL | Status: DC | PRN

## 2022-05-19 MED ORDER — ACETAMINOPHEN 325 MG PO TABS: 650.0000 mg | ORAL_TABLET | Freq: Four times a day (QID) | ORAL | Status: DC | PRN

## 2022-05-19 NOTE — ED Notes (Signed)
Pt agrees to POC and PCR COVID but refuses all other testing
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
## 2022-05-19 NOTE — ED Provider Notes (Signed)
[facility] Urgent Care Continuous Assessment Admission H&P  Date: 05/20/22 Patient Name: Michelle Strickland MRN: [id number] Chief Complaint: No chief complaint on file.     Diagnoses:  Final diagnoses:  Homicidal ideation  Suicidal ideation  Behavior concern in adult    HPI: Michelle Strickland, 36 y.o female,  with a history of Depression,  PtSD,  present to [facility] by [employer],  under IVC.  Per the IVC respondent 36 year old female.  Several diagnoses.  Respondent's threatening suicide on today.  Respondent also told partner that if she were to leave her she would kill her as well.  Respondent barricade herself in a room with a gun, threatening to kill anyone who tried  to come into her room.  Law enforcement responded.. Patient denies all of the above statement.  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  Per the patient, she goes to therapy at [facility] currently take Lamictal for depression, patient stated they started her on Seroquel but she has not picked up the medicine yet.  Patient has a history of suicide attempt at age 81. The patient she has a gun, but she keeps in her room.  Given the patient's history and current situation, patient access to a gun needs to be addressed.  Copied from TTS notes, Despite IVC papers indicating that patient threatened to end her life, patient denies allegations. Patient denies current and/or recent suicidal ideations. However, does acknowledge a history of suicide ideations, attempts and/or gestures. States that her last suicide attempt was at the age of 36 yrs old, triggered by homelessness. Patient also has a history of self mutilating behaviors, last occurrence was before the age of 36 yrs old. Today, she is able to contract for safety. Identifies protective factors that include: "My job as a Lawyer, my part time job with [employer], and my son who is 63 yrs old)". Patient does acknowledge access to means, #1 firearm. She refuses to allow anyone to remove guns from her home. Patient denies HI. Denies hx of aggressive and/or assaultive behaviors. Denies current legal issues/court dates/probation/parole. Patient was asked about her history of AVH's and initially responded, "Not anymore because I take my medication". She denies current AVH's and does not appear to be responding to internal stimuli. Also, no paranoia displayed. Denies current alcohol/drug use. 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 Patient was asked about history of use and last use, however; guarded in her responses. Patient with current outpatient therapist and medication management at [facility]  Observation of patient, patient is alert and oriented x4 speech is clear mood is angry and anxious, affect agitated.  Patient denies SI, HI, AVH, or paranoia.  Patient denies all report in the IVC.  Patient denies alcohol use but then stated she only drank some like a month ago at a cookout.  Patient denies illegal drug use.  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  Per the patient she lives with her girlfriend and her son.  Recommend inpatient observation  PHQ 2-9:     Total Time spent with patient: 20 minutes  Musculoskeletal  Strength & Muscle Tone: within normal limits Gait & Station: normal Patient leans: N/A  Psychiatric Specialty Exam  Presentation General Appearance: Casual  Eye Contact:Fair  Speech:Clear and Coherent  Speech Volume:Normal  Handedness:Ambidextrous  Mood and Affect  Mood:Anxious; Angry  Affect:Blunt   Thought Process  Thought Processes:Goal Directed  Descriptions of Associations:Circumstantial  Orientation:Full (Time, Place and Person)  Thought Content:WDL    Hallucinations:Hallucinations: None  Ideas of Reference:None  Suicidal Thoughts:Suicidal Thoughts: No  Homicidal  Thoughts:Homicidal Thoughts: No   Sensorium  Memory:Immediate Fair  Judgment:Fair  Insight:Fair   Executive Functions  Concentration:Fair  Attention Span:Fair  Recall:Fair  Fund of Knowledge:Fair  Language:Fair   Psychomotor Activity  Psychomotor Activity:Psychomotor Activity: Normal   Assets  Assets:Desire for Improvement   Sleep  Sleep:Sleep: Fair   Nutritional Assessment (For OBS and FBC admissions only) Has the patient had a weight loss or gain of 10 pounds or more in the last 3 months?: No Has the patient had a decrease in food intake/or appetite?: No Does the patient have dental problems?: No Does the patient have eating habits or behaviors that may be indicators of an eating disorder including binging or inducing vomiting?: No Has the patient recently lost weight without trying?: 0 Has the patient been eating poorly because of a decreased appetite?: 0 Malnutrition Screening Tool Score: 0    Physical Exam HENT:     Head: Normocephalic.     Nose: Nose normal.  Cardiovascular:     Rate and Rhythm: Normal rate.  Pulmonary:     Effort: Pulmonary effort is normal.  Musculoskeletal:        General: Normal range of motion.     Cervical back: Normal range of motion.  Skin:    General: Skin is warm.  Neurological:     General: No focal deficit present.     Mental Status: She is alert.  Psychiatric:        Mood and Affect: Mood normal.        Behavior: Behavior normal.        Thought Content: Thought content normal.        Judgment: Judgment normal.   Review of Systems  Constitutional: Negative.   HENT: Negative.    Eyes: Negative.   Respiratory: Negative.    Cardiovascular: Negative.   Gastrointestinal: Negative.   Genitourinary: Negative.   Musculoskeletal: Negative.   Skin: Negative.   Neurological: Negative.   Endo/Heme/Allergies: Negative.   Psychiatric/Behavioral:  Positive for suicidal ideas. The patient is nervous/anxious.    Blood  pressure 125/86, pulse 62, temperature 98.4 F (36.9 C), temperature source Oral, resp. rate 18, SpO2 99 %. There is no height or weight on file to calculate BMI.  Past Psychiatric History: Depression, PTSD, schizophrenia.  Is the patient at risk to self? Yes  Has the patient been a risk to self in the past 6 months? No .    Has the patient been a risk to self within the distant past? Yes   Is the patient a risk to others? Yes   Has the patient been a risk to others in the past 6 months? Yes   Has the patient been a risk to others within the distant past? Yes   Past Medical History:  Past Medical History:  Diagnosis Date   Asthma    Bronchitis    Chronic diarrhea    COPD (chronic obstructive pulmonary disease) (HCC)    Depression    Environmental and seasonal allergies    Family history of adverse reaction to anesthesia    " My sister vomited and aspirated during surgery; they gave her too much anesthesia"   Fatty liver    H/O heartburn  Morbid obesity (HCC)    Osteoarthritis of right acromioclavicular joint    PTSD (post-traumatic stress disorder)    Schizophrenia (HCC)    Tendinosis of rotator cuff    impingement   Wears glasses     Past Surgical History:  Procedure Laterality Date   CHOLECYSTECTOMY     COLONOSCOPY     SHOULDER ARTHROSCOPY Right 01/31/2019   Procedure: RIGHT SHOULDER ARTHROSCOPIC DEBRIDEMENT ROTATOR CUFF; SUBACROMIAL DECOMPRESSION; DISTAL CLAVICLE EXCISION;  Surgeon: Jones Broom, MD;  Location: [facility] OR;  Service: Orthopedics;  Laterality: Right;   WISDOM TOOTH EXTRACTION      Family History: No family history on file.  Social History:  Social History   Socioeconomic History   Marital status: Single    Spouse name: Not on file   Number of children: Not on file   Years of education: Not on file   Highest education level: Not on file  Occupational History   Not on file  Tobacco Use   Smoking status: Never   Smokeless tobacco: Never  Vaping  Use   Vaping Use: Never used  Substance and Sexual Activity   Alcohol use: Yes    Comment: occasional   Drug use: Not Currently   Sexual activity: Not on file  Other Topics Concern   Not on file  Social History Narrative   Not on file   Social Determinants of Health   Financial Resource Strain: Not on file  Food Insecurity: Not on file  Transportation Needs: Not on file  Physical Activity: Not on file  Stress: Not on file  Social Connections: Not on file  Intimate Partner Violence: Not on file    SDOH:  SDOH Screenings   Alcohol Screen: Not on file  Depression (PHQ2-9): Not on file  Financial Resource Strain: Not on file  Food Insecurity: Not on file  Housing: Not on file  Physical Activity: Not on file  Social Connections: Not on file  Stress: Not on file  Tobacco Use: Not on file  Transportation Needs: Not on file    Last Labs:  Admission on 05/19/2022  Component Date Value Ref Range Status   SARS Coronavirus 2 by RT PCR 05/19/2022 NEGATIVE  NEGATIVE Final   Comment: (NOTE) SARS-CoV-2 target nucleic acids are NOT DETECTED.  The SARS-CoV-2 RNA is generally detectable in upper respiratory specimens during the acute phase of infection. The lowest concentration of SARS-CoV-2 viral copies this assay can detect is 138 copies/mL. A negative result does not preclude SARS-Cov-2 infection and should not be used as the sole basis for treatment or other patient management decisions. A negative result may occur with  improper specimen collection/handling, submission of specimen other than nasopharyngeal swab, presence of viral mutation(s) within the areas targeted by this assay, and inadequate number of viral copies(<138 copies/mL). A negative result must be combined with clinical observations, patient history, and epidemiological information. The expected result is Negative.  Fact Sheet for Patients:  BloggerCourse.com  Fact Sheet for  Healthcare Providers:  SeriousBroker.it  This test is no                          t yet approved or cleared by the [place] FDA and  has been authorized for detection and/or diagnosis of SARS-CoV-2 by FDA under an Emergency Use Authorization (EUA). This EUA will remain  in effect (meaning this test can be used) for the duration of the COVID-19 declaration under Section 564(b)(1) of  the Act, 21 U.S.C.section 360bbb-3(b)(1), unless the authorization is terminated  or revoked sooner.       Influenza A by PCR 05/19/2022 NEGATIVE  NEGATIVE Final   Influenza B by PCR 05/19/2022 NEGATIVE  NEGATIVE Final   Comment: (NOTE) The Xpert Xpress SARS-CoV-2/FLU/RSV plus assay is intended as an aid in the diagnosis of influenza from Nasopharyngeal swab specimens and should not be used as a sole basis for treatment. Nasal washings and aspirates are unacceptable for Xpert Xpress SARS-CoV-2/FLU/RSV testing.  Fact Sheet for Patients: BloggerCourse.com  Fact Sheet for Healthcare Providers: SeriousBroker.it  This test is not yet approved or cleared by the [place] FDA and has been authorized for detection and/or diagnosis of SARS-CoV-2 by FDA under an Emergency Use Authorization (EUA). This EUA will remain in effect (meaning this test can be used) for the duration of the COVID-19 declaration under Section 564(b)(1) of the Act, 21 U.S.C. section 360bbb-3(b)(1), unless the authorization is terminated or revoked.  Performed at [facility], [address], [place], [place] [address]    SARSCOV2ONAVIRUS 2 AG 05/19/2022 NEGATIVE  NEGATIVE Final   Comment: (NOTE) SARS-CoV-2 antigen NOT DETECTED.   Negative results are presumptive.  Negative results do not preclude SARS-CoV-2 infection and should not be used as the sole basis for treatment or other patient management decisions, including infection  control  decisions, particularly in the presence of clinical signs and  symptoms consistent with COVID-19, or in those who have been in contact with the virus.  Negative results must be combined with clinical observations, patient history, and epidemiological information. The expected result is Negative.  Fact Sheet for Patients: https://www.jennings-kim.com/  Fact Sheet for Healthcare Providers: https://alexander-rogers.biz/  This test is not yet approved or cleared by the [place] FDA and  has been authorized for detection and/or diagnosis of SARS-CoV-2 by FDA under an Emergency Use Authorization (EUA).  This EUA will remain in effect (meaning this test can be used) for the duration of  the COV                          ID-19 declaration under Section 564(b)(1) of the Act, 21 U.S.C. section 360bbb-3(b)(1), unless the authorization is terminated or revoked sooner.      Allergies: Contrast media [iodinated contrast media], Penicillins, Toradol [ketorolac tromethamine], Tramadol, and Watermelon [citrullus vulgaris]  PTA Medications: (Not in a hospital admission)   Medical Decision Making  Inpatient observation Lab Orders         Resp Panel by RT-PCR (Flu A&B, Covid) Anterior Nasal Swab         CBC with Differential/Platelet         Comprehensive metabolic panel         Hemoglobin A1c         Ethanol         Lipid panel         TSH         Pregnancy, urine         CBG monitoring         POCT Urine Drug Screen - (I-Screen)         POC SARS Coronavirus 2 Ag      Meds ordered this encounter  Medications   acetaminophen (TYLENOL) tablet 650 mg   alum & mag hydroxide-simeth (MAALOX/MYLANTA) 200-200-20 MG/5ML suspension 30 mL   magnesium hydroxide (MILK OF MAGNESIA) suspension 30 mL       Recommendations  Based on my evaluation the patient does not appear to have an emergency medical condition.  Sindy Guadeloupe, NP 05/20/22  6:04 AM

## 2022-05-19 NOTE — ED Notes (Signed)
Pt refusing all labs and angrily requesting discharge . Denies suicidal ideation and is very angry with ex-girl friend and current significant other. States she never verbalized SI or HI but was accused of threatening to kill her self and her son.  Abie denies this ever occurred and is upset because she has to work in the morning and is afraid of losing her job. Stated "my girlfriend has already caused me to lose several days of work and this admission could make me lose my job"

## 2022-05-19 NOTE — BH Assessment (Addendum)
Comprehensive Clinical Assessment (CCA) Screening, Triage and Referral Note  05/19/2022 Michelle Strickland [id number]   Disposition: Triage/Screening completed. Per [facility] provider Carloyn Manner, NP), patient to remain in the ED overnight for observation. Pending am psych evaluation.   Chief Complaint: No chief complaint on file.  Visit Diagnosis: Major Depressive Disorder, Recurrent, Severe, w/o psychotic features   Patient Reported Information How did you hear about [place]? Legal System  What Is the Reason for Your Visit/Call Today? Michelle Strickland (preferred name is "Michelle Strickland) presents to the [facility] by [facility] with IVC papers. The IVC reads: "Respondent 36 year old female. Several diagnosis. Respondent threatening suicide on today. Respondent also told partner that is she were to leave her. Should would kill her as well. Respondent barricaded herself in a room with a gun, threatening to kill anyone who tried to come into room. Law enforcement responded."    Clinician met with patient for a face to face triage/screening. Upon observation, patient is irritable/angry. Also, tearful. Patient explains that she has a live in girlfriend, Michelle Strickland [phone]. Subsequently, Michelle Strickland is the petitioner. Patient and [name] have not been getting along.     Michelle Strickland has sole custody of this ex-girlfriends 28 y/o child. Therefore, the ex-girlfriend has remained in the picture and sometimes shows up unannounced as she did today. Their is on-going discord between patient the current girlfriend and ex girlfriend. Patient's current relationship is at risk of ending as the girlfriend threatened to move out today.     Despite IVC papers indicating that patient threatened to end her life, patient denies allegations. Patient denies current and/or recent suicidal ideations. However, does acknowledge a history of suicide ideations, attempts and/or gestures. States that her last suicide attempt was at the age of 36 yrs old,  triggered by homelessness. Patient also has a history of self mutilating behaviors, last occurrence was before the age of 36 yrs old. Today, she is able to contract for safety. Identifies protective factors that include: "My job as a Quarry manager, my part time job with [employer], and my son who is 29 yrs old)". Patient does acknowledge access to means, #1 firearm. She refuses to allow anyone to remove guns from her home.    Patient denies HI. Denies hx of aggressive and/or assaultive behaviors. Denies current legal issues/court dates/probation/parole. Patient was asked about her history of AVH's and initially responded, "Not anymore because I take my medication". She denies current AVH's and does not appear to be responding to internal stimuli. Also, no paranoia displayed.     Denies current alcohol/drug use. 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 Patient was asked about history of use and last use, however; guarded in her responses.    Patient with current outpatient therapist and medication management at [facility].  How Long Has This Been Causing You Problems? 1-6 months  What Do You Feel Would Help You the Most Today? Treatment for Depression or other mood problem; Medication(s); Stress Management   Have You Recently Had Any Thoughts About Hurting Yourself? No  Are You Planning to Commit Suicide/Harm Yourself At This time? No   Have you Recently Had Thoughts About [place]? No  Are You Planning to Harm Someone at This Time? No  Explanation: No data recorded  Have You Used Any Alcohol or Drugs in the Past 24 Hours? No  How Long Ago Did You Use Drugs or Alcohol? No data recorded What Did You Use and How Much? No data recorded  Do You Currently Have a Therapist/Psychiatrist? No data recorded Name of Therapist/Psychiatrist: No data recorded  Have You Been Recently Discharged From Any Office Practice or Programs? No data recorded Explanation of Discharge From  Practice/Program: No data recorded   CCA Screening Triage Referral Assessment Type of Contact: No data recorded Telemedicine Service Delivery:   Is this Initial or Reassessment? No data recorded Date Telepsych consult ordered in [facility]:  No data recorded Time Telepsych consult ordered in [facility]:  No data recorded Location of Assessment: No data recorded Provider Location: No data recorded  Collateral Involvement: No data recorded  Does Patient Have a [place]? No data recorded Name and Contact of Legal Guardian: No data recorded If Minor and Not Living with Parent(s), Who has Custody? No data recorded Is CPS involved or ever been involved? No data recorded Is APS involved or ever been involved? No data recorded  Patient Determined To Be At Risk for Harm To Self or Others Based on Review of Patient Reported Information or Presenting Complaint? No data recorded Method: No data recorded Availability of Means: No data recorded Intent: No data recorded Notification Required: No data recorded Additional Information for Danger to Others Potential: No data recorded Additional Comments for Danger to Others Potential: No data recorded Are There Guns or Other Weapons in Your Home? No data recorded Types of Guns/Weapons: No data recorded Are These Weapons Safely Secured?                            No data recorded Who Could Verify You Are Able To Have These Secured: No data recorded Do You Have any Outstanding Charges, Pending Court Dates, Parole/Probation? No data recorded Contacted To Inform of Risk of Harm To Self or Others: No data recorded  Does Patient Present under Involuntary Commitment? No data recorded IVC Papers Initial File Date: No data recorded  [place] of Residence: No data recorded  Patient Currently Receiving the Following Services: No data recorded  Determination of Need: Emergent (2 hours)   Options For Referral: Medication Management; Intensive  Outpatient Therapy; Partial Hospitalization (Continous Observation)   Discharge Disposition:     Waldon Merl, Counselor

## 2022-05-20 DIAGNOSIS — R45850 Homicidal ideations: Secondary | ICD-10-CM | POA: Diagnosis not present

## 2022-05-20 DIAGNOSIS — R45851 Suicidal ideations: Secondary | ICD-10-CM | POA: Diagnosis not present

## 2022-05-20 DIAGNOSIS — F69 Unspecified disorder of adult personality and behavior: Secondary | ICD-10-CM | POA: Diagnosis not present

## 2022-05-20 LAB — RESP PANEL BY RT-PCR (FLU A&B, COVID) ARPGX2
Influenza A by PCR: NEGATIVE
Influenza B by PCR: NEGATIVE
SARS Coronavirus 2 by RT PCR: NEGATIVE

## 2022-05-20 NOTE — ED Notes (Incomplete)
Mrs Trang is very an

## 2022-05-20 NOTE — Discharge Instructions (Addendum)
Discharge recommendations:  Patient is to take medications as prescribed. Follow up with your outpatient provider [facility] virtually for medication management. Follow up with your counselor at [facility] for therapy.  Please follow up with your primary care provider for all medical related needs.   Therapy: We recommend that patient participate in individual therapy to address mental health concerns.  Medications: The parent/guardian is to contact a medical professional and/or outpatient provider to address any new side effects that develop. Parent/guardian should update outpatient providers of any new medications and/or medication changes.   Safety:  The patient should abstain from use of illicit substances/drugs and abuse of any medications. If symptoms worsen or do not continue to improve or if the patient becomes actively suicidal or homicidal then it is recommended that the patient return to the closest hospital emergency department, the [facility], or call 911 for further evaluation and treatment. National Suicide Prevention Lifeline 1-800-SUICIDE or 7701090860.  About 988 988 offers 24/7 access to trained crisis counselors who can help people experiencing mental health-related distress. People can call or text 988 or chat 988lifeline.org for themselves or if they are worried about a loved one who may need crisis support.
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
## 2022-05-20 NOTE — ED Notes (Signed)
Remains asleepno distress or disturbed sleep patterns noted.

## 2022-05-20 NOTE — ED Notes (Signed)
Patient lying in bed at present talking with provider.  She is frustrated, irritable and anxious regarding events which resulted in her admission here.  Will monitor and provide supportive environment.

## 2022-07-17 ENCOUNTER — Ambulatory Visit (INDEPENDENT_AMBULATORY_CARE_PROVIDER_SITE_OTHER): Payer: 59

## 2022-07-17 ENCOUNTER — Ambulatory Visit
Admission: EM | Admit: 2022-07-17 | Discharge: 2022-07-17 | Disposition: A | Discharge status: Home / Self Care | Payer: 59

## 2022-07-17 DIAGNOSIS — M79644 Pain in right finger(s): Secondary | ICD-10-CM

## 2022-07-17 DIAGNOSIS — M79671 Pain in right foot: Secondary | ICD-10-CM

## 2022-07-17 MED ORDER — CYCLOBENZAPRINE HCL 10 MG PO TABS: 10.0000 mg | ORAL_TABLET | Freq: Every evening | ORAL | 0 refills | Status: DC | PRN

## 2022-07-17 NOTE — ED Provider Notes (Signed)
[facility]    CSN: [id number] Arrival date & time: 07/17/22  1127      History   Chief Complaint Chief Complaint  Patient presents with   Foot Pain    HPI Michelle Strickland is a 36 y.o. female.   Patient presenting today with 2-week history of right distal thumb pain after a fall where she landed on this hand.  She has range of motion in the thumb but she states it is very painful and stiff.  She denies discoloration, numbness, tingling, prior evaluation since time of fall.  Not taking anything over-the-counter for symptoms as she states all over-the-counter pain medication irritate her stomach.  She is also having right dorsal foot pain for the past week with no known injury.  She is on her feet all the time for work and that seems to make the pain worse.  He states she broke the foot in this area about a year ago and the pain now feels similar.  Has not been try anything over-the-counter for symptoms.   Past Medical History:  Diagnosis Date   Asthma    Bronchitis    Chronic diarrhea    COPD (chronic obstructive pulmonary disease) (HCC)    Depression    Environmental and seasonal allergies    Family history of adverse reaction to anesthesia    " My sister vomited and aspirated during surgery; they gave her too much anesthesia"   Fatty liver    H/O heartburn    Morbid obesity (HCC)    Osteoarthritis of right acromioclavicular joint    PTSD (post-traumatic stress disorder)    Schizophrenia (HCC)    Tendinosis of rotator cuff    impingement   Wears glasses     Patient Active Problem List   Diagnosis Date Noted   Mild intermittent asthma with acute exacerbation 07/21/2021   Viral hepatitis A without hepatic coma 11/18/2018   Severe episode of recurrent major depressive disorder, without psychotic features (HCC) 11/08/2018   Hirsutism 11/08/2018   Fatty liver 08/17/2017   Schizoaffective disorder, bipolar type (HCC) 02/14/2017   Posttraumatic stress  disorder 10/01/2014   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      Home Medications    Prior to Admission medications   Medication Sig Start Date End Date Taking? Authorizing Provider  cyclobenzaprine (FLEXERIL) 10 MG tablet Take 1 tablet (10 mg total) by mouth at bedtime as needed for muscle spasms. Do not drink alcohol or drive while taking this medication.  May cause drowsiness. 07/17/22  Yes Particia Nearing, PA-C  QUEtiapine Fumarate (SEROQUEL PO) Take by mouth.   Yes [provider]  testosterone enanthate (DELATESTRYL) 200 MG/ML injection Inject into the muscle every 14 (fourteen) days. For IM use only   Yes [provider]  venlafaxine (EFFEXOR) 25 MG tablet Take 25 mg by mouth 2 (two) times daily.   Yes [provider]  lamoTRIgine (  LAMICTAL) 25 MG tablet Take 25-50 mg by mouth daily. Take 1 tablet daily for 14 days, then increase to 2 tablets daily starting on 04/25/22. 04/25/22   [provider]    Family History History reviewed. No pertinent family history.  Social History Social History   Tobacco Use   Smoking status: Never   Smokeless tobacco: Never  Vaping Use   Vaping Use: Never used  Substance Use Topics   Alcohol use: Yes    Comment: occasional   Drug use: Not Currently     Allergies   Contrast media [iodinated contrast media], Penicillins, Toradol  [ketorolac tromethamine], Tramadol, Nsaids, and Watermelon [citrullus vulgaris]   Review of Systems Review of Systems Per HPI  Physical Exam Triage Vital Signs ED Triage Vitals  Enc Vitals Group     BP 07/17/22 1135 124/84     Pulse Rate 07/17/22 1135 72     Resp 07/17/22 1135 16     Temp 07/17/22 1135 98.2 F (36.8 C)     Temp Source 07/17/22 1135 Oral     SpO2 07/17/22 1135 94 %     Weight --      Height --      Head Circumference --      Peak Flow --      Pain Score 07/17/22 1132 2     Pain Loc --      Pain Edu? --      Excl. in GC? --    No data found.  Updated Vital Signs BP 124/84 (BP Location: Left Arm)   Pulse 72   Temp 98.2 F (36.8 C) (Oral)   Resp 16   SpO2 94%   Visual Acuity Right Eye Distance:   Left Eye Distance:   Bilateral Distance:    Right Eye Near:   Left Eye Near:    Bilateral Near:     Physical Exam Vitals and nursing note reviewed.  Constitutional:      Appearance: Normal appearance. She is not ill-appearing.  HENT:     Head: Atraumatic.  Eyes:     Extraocular Movements: Extraocular movements intact.     Conjunctiva/sclera: Conjunctivae normal.  Cardiovascular:     Rate and Rhythm: Normal rate and regular rhythm.     Heart sounds: Normal heart sounds.  Pulmonary:     Effort: Pulmonary effort is normal.     Breath sounds: Normal breath sounds.  Musculoskeletal:        General: Tenderness and signs of injury present. No swelling or deformity. Normal range of motion.     Cervical back: Normal range of motion and neck supple.     Comments: Right thumb tender to palpation DIP.  Range of motion intact, no bone deformity palpable.  Right foot tender to palpation dorsally and laterally with no bony deformities palpable and range of motion intact but painful.  Weightbearing without difficulty.  Skin:    General: Skin is warm and dry.     Findings: No bruising or erythema.  Neurological:     Mental Status: She is alert and oriented to  person, place, and time.     Motor: No weakness.     Gait: Gait normal.     Comments: Right upper and lower extremities neurovascularly intact  Psychiatric:        Mood and Affect: Mood normal.        Thought Content: Thought content normal.        Judgment: Judgment normal.  UC Treatments / Results  Labs (all labs ordered are listed, but only abnormal results are displayed) Labs Reviewed - No data to display  EKG   Radiology DG Foot Complete Right  Result Date: 07/17/2022 CLINICAL DATA:  Pain x1 week EXAM: RIGHT FOOT COMPLETE - 3+ VIEW COMPARISON:  None Available. FINDINGS: There is no evidence of fracture or dislocation. There is no evidence of arthropathy or other focal bone abnormality. Soft tissues are unremarkable. IMPRESSION: No fracture or dislocation is seen in right foot. Electronically Signed   By: Ernie Avena M.D.   On: 07/17/2022 12:14   DG Finger Thumb Right  Result Date: 07/17/2022 CLINICAL DATA:  Trauma, fall 2 weeks earlier, pain EXAM: RIGHT THUMB 2+V COMPARISON:  None Available. FINDINGS: No recent fracture or dislocation is seen. Small smoothly marginated calcifications in the palmar aspect of first metacarpophalangeal joint and the intercarpal joint of the right thumb most likely suggest sesamoid bones. There are no opaque foreign bodies. IMPRESSION: No fracture or dislocation is seen in right thumb. Electronically Signed   By: Ernie Avena M.D.   On: 07/17/2022 12:12    Procedures Procedures (including critical care time)  Medications Ordered in UC Medications - No data to display  Initial Impression / Assessment and Plan / UC Course  I have reviewed the triage vital signs and the nursing notes.  Pertinent labs & imaging results that were available during my care of the patient were reviewed by me and considered in my medical decision making (see chart for details).     X-ray of the right thumb and right foot both negative for acute bony  abnormalities.  She declines any pain relievers as she is intolerant to them, will prescribe some Flexeril at bedtime as needed and discussed Epsom salt soaks, RICE protocol.  Return for worsening symptoms.  Final Clinical Impressions(s) / UC Diagnoses   Final diagnoses:  Pain of right thumb  Right foot pain   Discharge Instructions   None    ED Prescriptions     Medication Sig Dispense Auth. Provider   cyclobenzaprine (FLEXERIL) 10 MG tablet Take 1 tablet (10 mg total) by mouth at bedtime as needed for muscle spasms. Do not drink alcohol or drive while taking this medication.  May cause drowsiness. 10 tablet Particia Nearing, [place]      PDMP not reviewed this encounter.   Particia Nearing, [place] 07/17/22 1254

## 2022-07-17 NOTE — ED Triage Notes (Signed)
Pt presents with rt foot pain that began Friday and rt thumb pain x 2 weeks.

## 2022-07-22 ENCOUNTER — Encounter (HOSPITAL_COMMUNITY): Payer: Self-pay | Admitting: Emergency Medicine

## 2022-07-22 ENCOUNTER — Emergency Department (HOSPITAL_COMMUNITY): Payer: 59

## 2022-07-22 ENCOUNTER — Other Ambulatory Visit: Payer: Self-pay

## 2022-07-22 ENCOUNTER — Emergency Department (HOSPITAL_COMMUNITY)
Admission: EM | Admit: 2022-07-22 | Discharge: 2022-07-22 | Disposition: A | Discharge status: Home / Self Care | Payer: 59 | Attending: Emergency Medicine | Admitting: Emergency Medicine

## 2022-07-22 DIAGNOSIS — I10 Essential (primary) hypertension: Secondary | ICD-10-CM | POA: Diagnosis not present

## 2022-07-22 DIAGNOSIS — J449 Chronic obstructive pulmonary disease, unspecified: Secondary | ICD-10-CM | POA: Diagnosis not present

## 2022-07-22 DIAGNOSIS — M25571 Pain in right ankle and joints of right foot: Secondary | ICD-10-CM | POA: Diagnosis present

## 2022-07-22 DIAGNOSIS — G8918 Other acute postprocedural pain: Secondary | ICD-10-CM | POA: Insufficient documentation

## 2022-07-22 DIAGNOSIS — G8929 Other chronic pain: Secondary | ICD-10-CM

## 2022-07-22 NOTE — ED Provider Notes (Signed)
[facility]-EMERGENCY DEPT Provider Note   CSN: [id number] Arrival date & time: 07/22/22  1835     History  Chief Complaint  Patient presents with   Ankle Pain    Michelle Strickland is a 36 y.o. female.   Ankle Pain  35 year old female presents emergency department with complaints of right ankle pain.  She states the pain has been constant for the past 2 weeks.  She denies any known inciting trauma or mechanism of injury.  She just notes pain is persistent since onset.  She has tried no medication for this at home because NSAIDs, Tylenol, tramadol "tear my stomach up.".  She is seen by an urgent care approximately 1 week ago with negative x-ray.  She has tried multiple different athletic shoes with minimal relief of symptoms.  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  Denies any known trauma, redness, swelling, weakness/sensory deficits distal to injury, feelings of instability.    Home Medications Prior to Admission medications   Medication Sig Start Date End Date Taking? Authorizing Provider  cyclobenzaprine (FLEXERIL) 10 MG tablet Take 1 tablet (10 mg total) by mouth at bedtime as needed for muscle spasms. Do not drink alcohol or drive while taking this medication.  May cause drowsiness. 07/17/22   Particia Nearing, PA-C  lamoTRIgine (LAMICTAL) 25 MG tablet Take 25-50 mg by mouth daily. Take 1 tablet daily for 14 days, then increase to 2 tablets daily starting on 04/25/22. 04/25/22   [provider]  QUEtiapine Fumarate (SEROQUEL PO) Take by mouth.    [provider]  testosterone enanthate (DELATESTRYL) 200 MG/ML injection Inject into the muscle every 14 (fourteen) days. For IM use only    [provider]  venlafaxine (EFFEXOR) 25 MG tablet Take 25 mg by mouth 2 (two) times daily.    [provider]      Allergies    Contrast media [iodinated contrast media], Penicillins,  Toradol [ketorolac tromethamine], Tramadol, Nsaids, and Watermelon [citrullus vulgaris]    Review of Systems   Review of Systems  All other systems reviewed and are negative.   Physical Exam Updated Vital Signs BP (!) 193/126 (BP Location: Right Arm)   Pulse 92   Temp 98.3 F (36.8 C) (Oral)   Resp 18   Ht 5\' 6"  (1.676 m)   Wt 127 kg   SpO2 95%   BMI 45.19 kg/m  Physical Exam Vitals and nursing note reviewed.  Constitutional:      General: She is not in acute distress.    Appearance: She is well-developed.  HENT:     Head: Normocephalic and atraumatic.  Eyes:     Conjunctiva/sclera: Conjunctivae normal.  Cardiovascular:     Rate and Rhythm: Normal rate and regular rhythm.     Heart sounds: No murmur heard. Pulmonary:     Effort: Pulmonary effort is normal. No respiratory distress.     Breath sounds: Normal breath sounds.  Abdominal:     Palpations: Abdomen is soft.     Tenderness: There is no abdominal tenderness.  Musculoskeletal:        General: No swelling.     Cervical back: Neck supple.       Feet:     Comments: No overlying skin outbreaks noted.  Minimal swelling in area noted in the elbow above.  No erythema, induration, areas of fluctuance.  Patient has tender to palpation  in area above.  No tender palpation of medial mall or lateral malleolus, base of fifth metatarsal.  Patient is full active range of motion in flexion, extension, inversion, Eversion.  She is complaining of no sensory deficits distal to affected injury.  Dorsalis pedis pulses full and intact bilaterally.  Anterior and posterior drawer negative.  Patient elicits pain with ankle inversion as well as ankle eversion.  Skin:    General: Skin is warm and dry.     Capillary Refill: Capillary refill takes less than 2 seconds.  Neurological:     Mental Status: She is alert.  Psychiatric:        Mood and Affect: Mood normal.     ED Results / Procedures / Treatments   Labs (all labs ordered are  listed, but only abnormal results are displayed) Labs Reviewed - No data to display  EKG None  Radiology DG Foot Complete Right  Result Date: 07/22/2022 CLINICAL DATA:  Right foot pain.  No known injury. EXAM: RIGHT FOOT COMPLETE - 3+ VIEW COMPARISON:  Right foot radiograph dated 07/17/2022. FINDINGS: There is no acute fracture or dislocation. The bones are well mineralized. There is a moderate sized os navicularis. There is minimal soft tissue edema over the os navicularis. No radiopaque foreign object or soft tissue gas. IMPRESSION: 1. No acute osseous pathology. 2. 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 Electronically Signed   By: Elgie Collard M.D.   On: 07/22/2022 19:23    Procedures Procedures    Medications Ordered in ED Medications - No data to display  ED Course/ Medical Decision Making/ A&P                           Medical Decision Making Amount and/or Complexity of Data Reviewed Radiology: ordered.   This patient presents to the ED for concern of right ankle pain, this involves an extensive number of treatment options, and is a complaint that carries with it a high risk of complications and morbidity.  The differential diagnosis includes fracture, strain/sprain, osteomyelitis, gout, rheumatoid arthritis, necrotizing fasciitis, cellulitis, erysipelas   Co morbidities that complicate the patient evaluation  Obesity, COPD, fatty liver, osteoarthritis, PTSD, schizophrenia, asthma   Additional history obtained:  Additional history obtained from partner who is at bedside External records from outside source obtained and reviewed including foot x-ray from 07/17/2022 indicating no acute abnormality   Lab Tests:  N/a   Imaging Studies ordered:  I ordered imaging studies including right foot x-ray I independently visualized and interpreted imaging which showed no acute abnormalities I agree with the radiologist interpretation   Cardiac  Monitoring: / EKG:  The patient was maintained on a cardiac monitor.  I personally viewed and interpreted the cardiac monitored which showed an underlying rhythm of: Sinus rhythm   Consultations Obtained:  N/a   Problem List / ED Course / Critical interventions / Medication management  Right ankle pain Reevaluation of the patient showed that the patient improved I have reviewed the patients home medicines and have made adjustments as needed   Social Determinants of Health:  Denies tobacco, illicit drug use   Test / Admission - Considered:  Right ankle pain Vitals signs significant for hypertension.  Recommend close follow-up with PCP regarding high blood pressure. Otherwise within normal range and stable throughout visit. Laboratory/imaging studies significant for: No acute abnormalities Patient's work-up today overall negative for any acute fracture.  Patient will be prescribed ASO  lace up brace as well as crutches to use as needed.  Recommend close follow-up with orthopedics in 5 to 7 days for reevaluation of said injury.  No further work-up needed while in the emergency department.  Doubt septic arthritis.  Doubt gout. Worrisome signs and symptoms were discussed with the patient, and the patient acknowledged understanding to return to the ED if noticed. Patient was stable upon discharge.         Final Clinical Impression(s) / ED Diagnoses Final diagnoses:  Chronic pain of right ankle    Rx / DC Orders ED Discharge Orders     None         Peter Garter, [place] 07/22/22 2021    Melene Plan, DO 07/22/22 2035

## 2022-07-22 NOTE — Progress Notes (Signed)
Orthopedic Tech Progress Note Patient Details:  Michelle Strickland [birth year] [id number]  Ortho Devices Type of Ortho Device: CAM walker Ortho Device/Splint Location: Right foot Ortho Device/Splint Interventions: Application   Post Interventions Patient Tolerated: Well  [name] Michelle Strickland 07/22/2022, 8:35 PM

## 2022-07-22 NOTE — Discharge Instructions (Signed)
Note your work-up today was overall negative for fracture.  We will place her in a cam walker boot as you requested.  Follow-up with orthopedics in 7 to 10 days for reevaluation.  Please do not hesitate to return to the emergency department if the worrisome signs and symptoms we discussed become apparent.

## 2022-07-22 NOTE — ED Triage Notes (Signed)
Pt reports right ankle pain x 2 weeks. Reports feels the same as when she broke it and states she is unable to walk on it

## 2022-07-24 ENCOUNTER — Emergency Department (HOSPITAL_COMMUNITY)
Admission: EM | Admit: 2022-07-24 | Discharge: 2022-07-24 | Disposition: A | Discharge status: Home / Self Care | Payer: 59 | Attending: Emergency Medicine | Admitting: Emergency Medicine

## 2022-07-24 ENCOUNTER — Encounter (HOSPITAL_COMMUNITY): Payer: Self-pay

## 2022-07-24 ENCOUNTER — Other Ambulatory Visit: Payer: Self-pay

## 2022-07-24 DIAGNOSIS — Q742 Other congenital malformations of lower limb(s), including pelvic girdle: Secondary | ICD-10-CM

## 2022-07-24 DIAGNOSIS — S99921A Unspecified injury of right foot, initial encounter: Secondary | ICD-10-CM | POA: Diagnosis present

## 2022-07-24 DIAGNOSIS — X58XXXA Exposure to other specified factors, initial encounter: Secondary | ICD-10-CM | POA: Diagnosis not present

## 2022-07-24 DIAGNOSIS — S92254A Nondisplaced fracture of navicular [scaphoid] of right foot, initial encounter for closed fracture: Secondary | ICD-10-CM | POA: Insufficient documentation

## 2022-07-24 DIAGNOSIS — M79671 Pain in right foot: Secondary | ICD-10-CM | POA: Diagnosis not present

## 2022-07-24 MED ORDER — NAPROXEN 375 MG PO TABS: 375.0000 mg | ORAL_TABLET | Freq: Two times a day (BID) | ORAL | 0 refills | Status: DC

## 2022-07-24 MED ORDER — DICLOFENAC SODIUM 1 % EX GEL
2.0000 g | Freq: Once | CUTANEOUS | Status: AC
Start: 2022-07-24 — End: 2022-07-24
  Administered 2022-07-24: 2 g via TOPICAL
  Filled 2022-07-24: qty 100

## 2022-07-24 MED ORDER — DICLOFENAC SODIUM 1 % EX GEL: 2.0000 g | Freq: Four times a day (QID) | CUTANEOUS | 0 refills | Status: DC

## 2022-07-24 NOTE — ED Triage Notes (Signed)
Patient reports right foot pain x 3 days. Patient does not endorse a specific injury. Patient states she was seen  3 days ago for the same. Patient has slight swelling to the right foot and states numbness to the right pinky toe.

## 2022-07-24 NOTE — ED Provider Notes (Signed)
[facility]-EMERGENCY DEPT Provider Note   CSN: [id number] Arrival date & time: 07/24/22  1702     History  Chief Complaint  Patient presents with   Foot Pain    Michelle Strickland is a 36 y.o. female.  HPI 36 year old female presents with 2 weeks of right foot pain.  Is not improving and seems to be worsening.  Seen here 2 days ago.  Has not yet followed up with orthopedics though is scheduling to call tomorrow.  Has not taken any medicines as medicine such as ibuprofen, Tylenol, and others tend to cause her stomach to be upset.  Feels a little swollen.  No fevers or systemic symptoms.  She has noticed that it feels like her right pinky toe feels numb and tingly.  No trauma.  Home Medications Prior to Admission medications   Medication Sig Start Date End Date Taking? Authorizing Provider  diclofenac Sodium (VOLTAREN) 1 % GEL Apply 2 g topically 4 (four) times daily. 07/24/22  Yes Pricilla Loveless, MD  naproxen (NAPROSYN) 375 MG tablet Take 1 tablet (375 mg total) by mouth 2 (two) times daily. 07/24/22  Yes Pricilla Loveless, MD  cyclobenzaprine (FLEXERIL) 10 MG tablet Take 1 tablet (10 mg total) by mouth at bedtime as needed for muscle spasms. Do not drink alcohol or drive while taking this medication.  May cause drowsiness. 07/17/22   Particia Nearing, PA-C  lamoTRIgine (LAMICTAL) 25 MG tablet Take 25-50 mg by mouth daily. Take 1 tablet daily for 14 days, then increase to 2 tablets daily starting on 04/25/22. 04/25/22   [provider]  QUEtiapine Fumarate (SEROQUEL PO) Take by mouth.    [provider]  testosterone enanthate (DELATESTRYL) 200 MG/ML injection Inject into the muscle every 14 (fourteen) days. For IM use only    [provider]  venlafaxine (EFFEXOR) 25 MG tablet Take 25 mg by mouth 2 (two) times daily.    [provider]      Allergies    Contrast media [iodinated contrast media], Penicillins, Toradol [ketorolac  tromethamine], Tramadol, Nsaids, and Watermelon [citrullus vulgaris]    Review of Systems   Review of Systems  Musculoskeletal:  Positive for arthralgias and joint swelling.  Neurological:  Positive for numbness.    Physical Exam Updated Vital Signs BP (!) 132/103 (BP Location: Left Arm)   Pulse 77   Temp 98.3 F (36.8 C) (Oral)   Resp 16   Ht 5\' 6"  (1.676 m)   Wt 127 kg   SpO2 98%   BMI 45.19 kg/m  Physical Exam Vitals and nursing note reviewed.  Constitutional:      Appearance: She is well-developed.  HENT:     Head: Normocephalic and atraumatic.  Cardiovascular:     Rate and Rhythm: Normal rate and regular rhythm.     Pulses:          Dorsalis pedis pulses are 2+ on the right side.  Pulmonary:     Effort: Pulmonary effort is normal.  Abdominal:     General: There is no distension.  Musculoskeletal:       Feet:  Feet:     Comments: Primarily proximal and mid-lateral tenderness and maybe a little bit of swelling to the foot. Right pinky toe has similar capillary refill to the other toes and has normal warmth.  She can move it though it is limited.  She can feel me touching. No obvious cellulitis or fluctuance or induration Skin:  General: Skin is warm and dry.  Neurological:     Mental Status: She is alert.     ED Results / Procedures / Treatments   Labs (all labs ordered are listed, but only abnormal results are displayed) Labs Reviewed - No data to display  EKG None  Radiology No results found.  Procedures Procedures    Medications Ordered in ED Medications  diclofenac Sodium (VOLTAREN) 1 % topical gel 2 g (has no administration in time range)    ED Course/ Medical Decision Making/ A&P                           Medical Decision Making Risk Prescription drug management.   Patient's previous x-rays seem to indicate an accessory navicular bone.  This would be a little more medial for the same time it is in the same proximal area of her  foot that would be expected.  Unclear if that is causing her symptoms.  At this point, she already has a cam walker and I have recommended that she take anti-inflammatories for what is probably an inflammatory condition.  I highly doubt septic joint and I think gout is unlikely.  There are some mild tenderness but no erythema or cellulitis.  Unclear why her toes feeling numb though she is also just recently been in the boot so I wonder if it is gotten compressed.  At this point I think she needs a follow-up with podiatry and states she can follow-up with podiatry or orthopedics, her choice.  I have also strongly recommended NSAIDs and will provide her naproxen to see if this is better than ibuprofen as well as topical Voltaren.  Will discharge home with return precautions.  I do not think repeat imaging needed.        Final Clinical Impression(s) / ED Diagnoses Final diagnoses:  Accessory navicular bone of right foot    Rx / DC Orders ED Discharge Orders          Ordered    naproxen (NAPROSYN) 375 MG tablet  2 times daily        07/24/22 2009    diclofenac Sodium (VOLTAREN) 1 % GEL  4 times daily        07/24/22 2009              Pricilla Loveless, MD 07/24/22 2025

## 2022-07-24 NOTE — Discharge Instructions (Addendum)
Return to the ER if you develop new or worsening pain, fever, vomiting, redness to the skin or new swelling, or any other new/concerning symptoms.

## 2022-07-24 NOTE — ED Notes (Signed)
Patient was seen 7/23 and 7/28 for foot pain without any injury noted

## 2022-07-24 NOTE — ED Notes (Signed)
Patient given discharge instructions, all questions answered. Patient in possession of all belongings, directed to the discharge area  

## 2022-07-31 ENCOUNTER — Observation Stay (HOSPITAL_COMMUNITY): Payer: 59

## 2022-07-31 ENCOUNTER — Emergency Department (HOSPITAL_COMMUNITY): Payer: 59

## 2022-07-31 ENCOUNTER — Encounter (HOSPITAL_COMMUNITY): Payer: Self-pay | Admitting: *Deleted

## 2022-07-31 ENCOUNTER — Other Ambulatory Visit: Payer: Self-pay

## 2022-07-31 ENCOUNTER — Observation Stay (HOSPITAL_COMMUNITY)
Admission: EM | Admit: 2022-07-31 | Discharge: 2022-08-02 | Disposition: A | Discharge status: Home / Self Care | Payer: 59 | Attending: Internal Medicine | Admitting: Internal Medicine

## 2022-07-31 DIAGNOSIS — Z79899 Other long term (current) drug therapy: Secondary | ICD-10-CM | POA: Diagnosis not present

## 2022-07-31 DIAGNOSIS — J45909 Unspecified asthma, uncomplicated: Secondary | ICD-10-CM | POA: Insufficient documentation

## 2022-07-31 DIAGNOSIS — G4485 Primary stabbing headache: Secondary | ICD-10-CM

## 2022-07-31 DIAGNOSIS — J449 Chronic obstructive pulmonary disease, unspecified: Secondary | ICD-10-CM | POA: Diagnosis not present

## 2022-07-31 DIAGNOSIS — R030 Elevated blood-pressure reading, without diagnosis of hypertension: Secondary | ICD-10-CM | POA: Diagnosis not present

## 2022-07-31 DIAGNOSIS — F250 Schizoaffective disorder, bipolar type: Secondary | ICD-10-CM

## 2022-07-31 DIAGNOSIS — I5042 Chronic combined systolic (congestive) and diastolic (congestive) heart failure: Secondary | ICD-10-CM | POA: Diagnosis not present

## 2022-07-31 DIAGNOSIS — R519 Headache, unspecified: Secondary | ICD-10-CM | POA: Diagnosis not present

## 2022-07-31 DIAGNOSIS — R079 Chest pain, unspecified: Principal | ICD-10-CM | POA: Diagnosis present

## 2022-07-31 DIAGNOSIS — R778 Other specified abnormalities of plasma proteins: Secondary | ICD-10-CM

## 2022-07-31 DIAGNOSIS — Z6841 Body Mass Index (BMI) 40.0 and over, adult: Secondary | ICD-10-CM

## 2022-07-31 DIAGNOSIS — F1010 Alcohol abuse, uncomplicated: Secondary | ICD-10-CM

## 2022-07-31 LAB — CBC
HCT: 48.7 % — ABNORMAL HIGH (ref 36.0–46.0)
Hemoglobin: 15.7 g/dL — ABNORMAL HIGH (ref 12.0–15.0)
MCH: 30.6 pg (ref 26.0–34.0)
MCHC: 32.2 g/dL (ref 30.0–36.0)
MCV: 94.9 fL (ref 80.0–100.0)
Platelets: 240 10*3/uL (ref 150–400)
RBC: 5.13 MIL/uL — ABNORMAL HIGH (ref 3.87–5.11)
RDW: 15.5 % (ref 11.5–15.5)
WBC: 5.8 10*3/uL (ref 4.0–10.5)
nRBC: 0 % (ref 0.0–0.2)

## 2022-07-31 LAB — D-DIMER, QUANTITATIVE: D-Dimer, Quant: 0.35 ug/mL-FEU (ref 0.00–0.50)

## 2022-07-31 LAB — RAPID URINE DRUG SCREEN, HOSP PERFORMED
Amphetamines: NOT DETECTED
Barbiturates: NOT DETECTED
Benzodiazepines: NOT DETECTED
Cocaine: NOT DETECTED
Opiates: NOT DETECTED
Tetrahydrocannabinol: NOT DETECTED

## 2022-07-31 LAB — TSH: TSH: 2.306 u[IU]/mL (ref 0.350–4.500)

## 2022-07-31 LAB — BASIC METABOLIC PANEL
Anion gap: 7 (ref 5–15)
BUN: 12 mg/dL (ref 6–20)
CO2: 22 mmol/L (ref 22–32)
Calcium: 9 mg/dL (ref 8.9–10.3)
Chloride: 110 mmol/L (ref 98–111)
Creatinine, Ser: 1.08 mg/dL — ABNORMAL HIGH (ref 0.44–1.00)
GFR, Estimated: >60 mL/min (ref >60)
Glucose, Bld: 91 mg/dL (ref 70–99)
Potassium: 4.5 mmol/L (ref 3.5–5.1)
Sodium: 139 mmol/L (ref 135–145)

## 2022-07-31 LAB — TROPONIN I (HIGH SENSITIVITY)
Troponin I (High Sensitivity): 32 ng/L — ABNORMAL HIGH (ref <18)
Troponin I (High Sensitivity): 29 ng/L — ABNORMAL HIGH (ref <18)
Troponin I (High Sensitivity): 31 ng/L — ABNORMAL HIGH (ref <18)

## 2022-07-31 LAB — PHOSPHORUS: Phosphorus: 2.2 mg/dL — ABNORMAL LOW (ref 2.5–4.6)

## 2022-07-31 LAB — MAGNESIUM: Magnesium: 1.9 mg/dL (ref 1.7–2.4)

## 2022-07-31 LAB — PREGNANCY, URINE: Preg Test, Ur: NEGATIVE

## 2022-07-31 LAB — CK: Total CK: 159 U/L (ref 38–234)

## 2022-07-31 MED ORDER — SODIUM CHLORIDE 0.9 % IV SOLN: INTRAVENOUS | Status: DC

## 2022-07-31 MED ORDER — HYDROXYZINE HCL 10 MG PO TABS: 10.0000 mg | ORAL_TABLET | Freq: Three times a day (TID) | ORAL | Status: DC | PRN

## 2022-07-31 MED ORDER — LORAZEPAM 2 MG/ML IJ SOLN: 1.0000 mg | INTRAMUSCULAR | Status: DC | PRN

## 2022-07-31 MED ORDER — LORAZEPAM 1 MG PO TABS: 1.0000 mg | ORAL_TABLET | ORAL | Status: DC | PRN

## 2022-07-31 MED ORDER — QUETIAPINE FUMARATE 25 MG PO TABS
25.0000 mg | ORAL_TABLET | Freq: Every day | ORAL | Status: DC
  Administered 2022-07-31 – 2022-08-01 (×2): 25 mg via ORAL
  Filled 2022-07-31 (×2): qty 1

## 2022-07-31 MED ORDER — ADULT MULTIVITAMIN W/MINERALS CH
1.0000 | ORAL_TABLET | Freq: Every day | ORAL | Status: DC
  Administered 2022-08-01 – 2022-08-02 (×2): 1 via ORAL
  Filled 2022-07-31 (×2): qty 1

## 2022-07-31 MED ORDER — NITROGLYCERIN 0.4 MG SL SUBL
0.4000 mg | SUBLINGUAL_TABLET | SUBLINGUAL | Status: DC | PRN
Start: 2022-07-31 — End: 2022-08-02
  Administered 2022-07-31 (×3): 0.4 mg via SUBLINGUAL
  Filled 2022-07-31 (×2): qty 1

## 2022-07-31 MED ORDER — ACETAMINOPHEN 325 MG PO TABS
650.0000 mg | ORAL_TABLET | Freq: Once | ORAL | Status: AC
  Administered 2022-07-31: 650 mg via ORAL
  Filled 2022-07-31: qty 2

## 2022-07-31 MED ORDER — SODIUM CHLORIDE 0.9 % IV BOLUS
1000.0000 mL | Freq: Once | INTRAVENOUS | Status: AC
  Administered 2022-07-31: 1000 mL via INTRAVENOUS

## 2022-07-31 MED ORDER — ACETAMINOPHEN 325 MG PO TABS
650.0000 mg | ORAL_TABLET | Freq: Four times a day (QID) | ORAL | Status: DC | PRN
  Administered 2022-08-01: 650 mg via ORAL
  Filled 2022-07-31: qty 2

## 2022-07-31 MED ORDER — MORPHINE SULFATE (PF) 2 MG/ML IV SOLN
2.0000 mg | Freq: Once | INTRAVENOUS | Status: AC
  Administered 2022-07-31: 2 mg via INTRAVENOUS
  Filled 2022-07-31: qty 1

## 2022-07-31 MED ORDER — ENOXAPARIN SODIUM 60 MG/0.6ML IJ SOSY
60.0000 mg | PREFILLED_SYRINGE | INTRAMUSCULAR | Status: DC
Start: 2022-07-31 — End: 2022-07-31

## 2022-07-31 MED ORDER — POTASSIUM PHOSPHATES 15 MMOLE/5ML IV SOLN
15.0000 mmol | Freq: Once | INTRAVENOUS | Status: AC
  Administered 2022-08-01: 15 mmol via INTRAVENOUS
  Filled 2022-07-31: qty 5

## 2022-07-31 MED ORDER — PANTOPRAZOLE SODIUM 40 MG PO TBEC
40.0000 mg | DELAYED_RELEASE_TABLET | Freq: Every day | ORAL | Status: DC
  Administered 2022-08-01 – 2022-08-02 (×2): 40 mg via ORAL
  Filled 2022-07-31 (×2): qty 1

## 2022-07-31 MED ORDER — ASPIRIN 81 MG PO TBEC
81.0000 mg | DELAYED_RELEASE_TABLET | Freq: Every day | ORAL | Status: DC
Start: 2022-08-01 — End: 2022-07-31

## 2022-07-31 MED ORDER — ASPIRIN 81 MG PO TBEC
81.0000 mg | DELAYED_RELEASE_TABLET | Freq: Every day | ORAL | Status: DC
  Administered 2022-08-01 – 2022-08-02 (×2): 81 mg via ORAL
  Filled 2022-07-31 (×2): qty 1

## 2022-07-31 MED ORDER — MORPHINE SULFATE (PF) 2 MG/ML IV SOLN
2.0000 mg | INTRAVENOUS | Status: DC | PRN
  Administered 2022-08-01 – 2022-08-02 (×6): 2 mg via INTRAVENOUS
  Filled 2022-07-31 (×6): qty 1

## 2022-07-31 MED ORDER — FOLIC ACID 1 MG PO TABS
1.0000 mg | ORAL_TABLET | Freq: Every day | ORAL | Status: DC
  Administered 2022-08-01 – 2022-08-02 (×2): 1 mg via ORAL
  Filled 2022-07-31 (×2): qty 1

## 2022-07-31 MED ORDER — THIAMINE HCL 100 MG/ML IJ SOLN: 100.0000 mg | Freq: Every day | INTRAMUSCULAR | Status: DC

## 2022-07-31 MED ORDER — ACETAMINOPHEN 650 MG RE SUPP: 650.0000 mg | Freq: Four times a day (QID) | RECTAL | Status: DC | PRN

## 2022-07-31 MED ORDER — VENLAFAXINE HCL ER 37.5 MG PO CP24
37.5000 mg | ORAL_CAPSULE | Freq: Every morning | ORAL | Status: DC
  Administered 2022-08-01 – 2022-08-02 (×2): 37.5 mg via ORAL
  Filled 2022-07-31 (×2): qty 1

## 2022-07-31 MED ORDER — THIAMINE HCL 100 MG PO TABS
100.0000 mg | ORAL_TABLET | Freq: Every day | ORAL | Status: DC
  Administered 2022-08-01 – 2022-08-02 (×2): 100 mg via ORAL
  Filled 2022-07-31 (×2): qty 1

## 2022-07-31 MED ORDER — HYDROCODONE-ACETAMINOPHEN 5-325 MG PO TABS
1.0000 | ORAL_TABLET | ORAL | Status: DC | PRN
  Administered 2022-07-31: 1 via ORAL
  Filled 2022-07-31: qty 1

## 2022-07-31 NOTE — Assessment & Plan Note (Signed)
Given severe headache in the beginning will obtain CT head.  Neurologically intact continue to monitor

## 2022-07-31 NOTE — Assessment & Plan Note (Signed)
Will need outpatient follow-up with nutrition 

## 2022-07-31 NOTE — Assessment & Plan Note (Signed)
Restart home medications including Effexor and Seroquel

## 2022-07-31 NOTE — ED Provider Notes (Signed)
[facility]-EMERGENCY DEPT Provider Note   CSN: [id number] Arrival date & time: 07/31/22  1302     History  Chief Complaint  Patient presents with   Chest Pain   Headache    Michelle Strickland is a 36 y.o. adult  Pt complains of pain in neck, into the head, became sharp chest pain with nausea, shortness of breath, worse with exertion after getting into an argument earlier today.  He reports family history of congestive heart failure, denies personal history of ACS, he has hx of obesity.  Previous history of schizoaffective disorder, borderline personality disorder, no documented previous history of hypertension, hyperlipidemia. Is taking exogenous testosterone. Denies recent travel, leg swelling, hemoptysis.   Chest Pain Associated symptoms: headache   Headache      Home Medications Prior to Admission medications   Medication Sig Start Date End Date Taking? Authorizing Provider  diclofenac Sodium (VOLTAREN) 1 % GEL Apply 2 g topically 4 (four) times daily. Patient taking differently: Apply 2 g topically 4 (four) times daily as needed (pain). 07/24/22  Yes Pricilla Loveless, MD  lamoTRIgine (LAMICTAL) 25 MG tablet Take 25 mg by mouth daily. 04/25/22  Yes [provider]  omeprazole (PRILOSEC) 20 MG capsule Take 20 mg by mouth daily as needed (heartburn). 06/22/22  Yes [provider]  ondansetron (ZOFRAN-ODT) 8 MG disintegrating tablet Take 8 mg by mouth every 8 (eight) hours as needed for nausea or vomiting. 06/22/22  Yes [provider]  promethazine (PHENERGAN) 25 MG tablet Take 25 mg by mouth every 6 (six) hours as needed for vomiting or nausea. 06/23/22  Yes [provider]  QUEtiapine (SEROQUEL) 25 MG tablet Take 25 mg by mouth at bedtime. 05/17/22  Yes [provider]  testosterone enanthate (DELATESTRYL) 200 MG/ML injection Inject 200 mg into the muscle every 7 (seven) days. For IM use only   Yes [provider]  venlafaxine XR (EFFEXOR-XR) 37.5 MG 24 hr capsule Take 37.5 mg by mouth every morning. 06/09/22  Yes [provider]  cyclobenzaprine (FLEXERIL) 10 MG tablet Take 1 tablet (10 mg total) by mouth at bedtime as needed for muscle spasms. Do not drink alcohol or drive while taking this medication.  May cause drowsiness. Patient not taking: Reported on 07/31/2022 07/17/22   Particia Nearing, PA-C  naproxen (NAPROSYN) 375 MG tablet Take 1 tablet (375 mg total) by mouth 2 (two) times daily. Patient not taking: Reported on 07/31/2022 07/24/22   Pricilla Loveless, MD      Allergies    Contrast media [iodinated contrast media], Penicillins, Toradol [ketorolac tromethamine], Tramadol, Nsaids, and Watermelon [citrullus vulgaris]    Review of Systems   Review of Systems  Cardiovascular:  Positive for chest pain.  Neurological:  Positive for headaches.    Physical Exam Updated Vital Signs BP (!) 134/95   Pulse 63   Temp 98.1 F (36.7 C) (Oral)   Resp (!) 21   Ht 5\' 6"  (1.676 m)   Wt 127 kg   SpO2 93%   BMI 45.19 kg/m  Physical Exam Vitals and nursing note reviewed.  Constitutional:      General: He is not in acute distress.    Appearance: Normal appearance. He is obese.  HENT:     Head: Normocephalic and atraumatic.  Eyes:     General:        Right eye: No discharge.        Left eye: No discharge.  Cardiovascular:  Rate and Rhythm: Normal rate and regular rhythm.     Heart sounds: No murmur heard.    No friction rub. No gallop.  Pulmonary:     Effort: Pulmonary effort is normal.     Breath sounds: Normal breath sounds.  Abdominal:     General: Bowel sounds are normal.     Palpations: Abdomen is soft.  Skin:    General: Skin is warm and dry.     Capillary Refill: Capillary refill takes less than 2 seconds.  Neurological:     Mental Status: He is alert and oriented to person, place, and time.     Comments: Cranial nerves II through XII grossly intact.  Intact  finger-nose, intact heel-to-shin.  Romberg negative, gait normal.  Alert and oriented x3.  Moves all 4 limbs spontaneously, normal coordination.  No pronator drift.  Intact strength 5 out of 5 bilateral upper and lower extremities.    Psychiatric:        Mood and Affect: Mood normal.        Behavior: Behavior normal.     ED Results / Procedures / Treatments   Labs (all labs ordered are listed, but only abnormal results are displayed) Labs Reviewed  BASIC METABOLIC PANEL - Abnormal; Notable for the following components:      Result Value   Creatinine, Ser 1.08 (*)    All other components within normal limits  CBC - Abnormal; Notable for the following components:   RBC 5.13 (*)    Hemoglobin 15.7 (*)    HCT 48.7 (*)    All other components within normal limits  TROPONIN I (HIGH SENSITIVITY) - Abnormal; Notable for the following components:   Troponin I (High Sensitivity) 32 (*)    All other components within normal limits  TROPONIN I (HIGH SENSITIVITY) - Abnormal; Notable for the following components:   Troponin I (High Sensitivity) 29 (*)    All other components within normal limits  PREGNANCY, URINE  D-DIMER, QUANTITATIVE  RAPID URINE DRUG SCREEN, HOSP PERFORMED  CK  PHOSPHORUS  MAGNESIUM  TSH  URINALYSIS, COMPLETE (UACMP) WITH MICROSCOPIC  TROPONIN I (HIGH SENSITIVITY)    EKG EKG Interpretation  Date/Time:  Sunday July 31 2022 15:57:49 EDT Ventricular Rate:  72 PR Interval:  135 QRS Duration: 95 QT Interval:  365 QTC Calculation: 400 R Axis:   107 Text Interpretation: Sinus rhythm LAE, consider biatrial enlargement Borderline right axis deviation Borderline T wave abnormalities Confirmed by Madalyn Rob [phone]) on 07/31/2022 4:04:23 PM  Radiology DG Chest 2 View  Result Date: 07/31/2022 CLINICAL DATA:  Chest pain.  Headache. EXAM: CHEST - 2 VIEW COMPARISON:  None Available. FINDINGS: Cardiac silhouette is normal in size. No mediastinal or hilar masses. No  evidence of adenopathy. Clear lungs.  No pleural effusion or pneumothorax. Skeletal structures are unremarkable. IMPRESSION: No active cardiopulmonary disease. Electronically Signed   By: Lajean Manes M.D.   On: 07/31/2022 13:57    Procedures Procedures    Medications Ordered in ED Medications  nitroGLYCERIN (NITROSTAT) SL tablet 0.4 mg (0.4 mg Sublingual Given 07/31/22 1634)  morphine (PF) 2 MG/ML injection 2 mg (2 mg Intravenous Given 07/31/22 1607)  morphine (PF) 2 MG/ML injection 2 mg (2 mg Intravenous Given 07/31/22 1806)  sodium chloride 0.9 % bolus 1,000 mL (0 mLs Intravenous Stopped 07/31/22 1948)  acetaminophen (TYLENOL) tablet 650 mg (650 mg Oral Given 07/31/22 1808)    ED Course/ Medical Decision Making/ A&P Clinical Course as of 07/31/22 1957  Wynelle Link Jul 31, 2022  1746 Narcisse [CP]    Clinical Course User Index [CP] [name], PA-C                           Medical Decision Making Amount and/or Complexity of Data Reviewed Labs: ordered. Radiology: ordered.  Risk OTC drugs. Prescription drug management. Decision regarding hospitalization.   This patient is a 36 y.o. adult who presents to the ED for concern of chest pain, headache, this involves an extensive number of treatment options, and is a complaint that carries with it a high risk of complications and morbidity. The emergent differential diagnosis prior to evaluation includes, but is not limited to,  ACS, AAS, PE, Mallory-Weiss, Boerhaave's, Pneumonia, acute bronchitis, asthma or COPD exacerbation, anxiety, MSK pain or traumatic injury to the chest, acid reflux versus other .   This is not an exhaustive differential.   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 Pertinent results include: Reviewed lab work, imaging from previous emergency department visits, outpatient urgent care visits  Physical Exam: Physical exam performed. The pertinent findings include: Patient overall in no distress at time of my arrival.  He has no acute neurologic deficits, no replicable chest pain on palpation, no respiratory distress at time my evaluation.  He has had some intermittent hypertension with diastolic pressure max of 101, systolic max of 142.  Lab Tests: I ordered, and personally interpreted labs.  The pertinent results include: Initial troponin elevated at 32, delta at 29.  Less concern for acute NSTEMI but elevated troponin still raises some concern with patient's story.  CBC overall unremarkable, BMP overall unremarkable.  D-dimer negative, pregnancy test negative.   Imaging Studies: I ordered imaging studies including plain film chest x-ray. I independently visualized and interpreted imaging which showed no acute intrathoracic abnormality. I agree with the radiologist interpretation.   Cardiac Monitoring:  The patient was maintained on a cardiac monitor.  My attending physician Dr. Stevie Kern viewed and interpreted the cardiac monitored which showed an underlying rhythm of: NSR, borderline T wave abnormalities. I agree with this interpretation.   Medications: I ordered medication including morphine, nitroglycerin sublingual,, Tylenol, fluid bolus for chest pain, headache. Reevaluation of the patient after these medicines showed that the patient improved. I have reviewed the patients home medicines and have made adjustments as needed.  Consultations Obtained: I requested consultation with the cardiologist, spoke with Dr. Hyacinth Meeker, who agrees to inpatient admission for high risk chest pain monitoring, spoke with Dr. Adela Glimpse who agrees to admission,  after discussion of lab and imaging findings as well as pertinent plan.   Disposition: After  consideration of the diagnostic results and the patients response to treatment, I feel that patient would benefit from admission.   I discussed this case with my attending physician Dr. Stevie Kern who cosigned this note including patient's presenting symptoms, physical exam, and planned diagnostics and interventions. Attending physician stated agreement with plan or made changes to plan which were implemented.    Final Clinical Impression(s) / ED Diagnoses Final diagnoses:  None    Rx / DC Orders ED Discharge Orders     None         [name], PA-C 07/31/22 1957    Marianna Fuss  S, MD 08/01/22 [phone]

## 2022-07-31 NOTE — Assessment & Plan Note (Signed)
Mild flat trend Continue to trend echo in the morning.  EKG nonischemic

## 2022-07-31 NOTE — Subjective & Objective (Signed)
When patient was having an argument with their significant other Experienced associated chest pain which was severe feeling anxious tachypneic tearful. Associated with a headache and every time they would get upset will get a headache Reports still chest pain underneath her right breast BP 145/111

## 2022-07-31 NOTE — ED Notes (Signed)
OK per EDP for pt to eat and drink

## 2022-07-31 NOTE — ED Notes (Signed)
Hospitalist at bedside 

## 2022-07-31 NOTE — Assessment & Plan Note (Signed)
Continue to monitor treat as needed patient will need to get follow-up as a primary care provider if does not improve

## 2022-07-31 NOTE — ED Notes (Signed)
Notified provider of pt's BP 145/111. Pt in NAD with family at bedside

## 2022-07-31 NOTE — ED Provider Triage Note (Signed)
Emergency Medicine Provider Triage Evaluation Note  Michelle Strickland , a 36 y.o. female  was evaluated in triage.  Pt complains of pain in neck, into the head, became sharp chest pain with nausea, shortness of breath, worse with exertion after getting into an argument earlier today.  She reports family history of congestive heart failure, denies personal history of ACS, she endorses obesity.  Previous history of schizoaffective disorder, borderline personality disorder, no documented previous history of hypertension, hyperlipidemia  Review of Systems  Positive: Chest pain, shortness of breath, headache, nausea Negative: Fever, chills  Physical Exam  BP 130/88 (BP Location: Left Arm)   Pulse (!) 58   Temp 98 F (36.7 C) (Oral)   Resp (!) 21   Ht 5\' 6"  (1.676 m)   Wt 127 kg   SpO2 98%   BMI 45.19 kg/m  Gen:   Awake, no distress   Resp:  Normal effort  MSK:   Moves extremities without difficulty  Other:  Patient with some anxiety over what is going on but otherwise no acute distress, normal heart and lung sounds on my exam  Medical Decision Making  Medically screening exam initiated at 1:51 PM.  Appropriate orders placed.  Michelle Strickland was informed that the remainder of the evaluation will be completed by another provider, this initial triage assessment does not replace that evaluation, and the importance of remaining in the ED until their evaluation is complete.  Workup initiated   Michelle Strickland, Michelle Strickland 07/31/22 1354

## 2022-07-31 NOTE — ED Notes (Signed)
Patient complaining of HA and chest pain to L side.  Medicated per verbal order from MD.  Patient made aware of plan of care

## 2022-07-31 NOTE — ED Triage Notes (Signed)
Patient presents with chest pain and a headache. Patients states his symptoms began at around noon when he was in an argument with his girlfriend.  States that he began to have a headache and then the chest pain and shortness of breath began. Currently denies SOB but does reports chest pain underneath the right breast. No diaphoresis noted.

## 2022-07-31 NOTE — Assessment & Plan Note (Signed)
Monitor for any sign of withdrawal Can start on CIWA protocol given hypertension

## 2022-07-31 NOTE — Assessment & Plan Note (Addendum)
Chest brain stress-induced. Troponins flat EKG nonischemic Discussed with cardiology who recommended admission To continue cycle cardiac enzymes obtain echo and may need a stress test Check D-dimer wnl Continue to monitor on telemetry appreciate cardiology insight

## 2022-07-31 NOTE — H&P (Addendum)
Michelle Strickland MWN:[id number] DOB: [birth year] DOA: 07/31/2022    PCP: Pcp, No   Outpatient Specialists:    Patient arrived to ER on 07/31/22 at 1302 Referred by Attending Therisa Doyne, MD   Patient coming from:    home Lives With  SO    Chief Complaint:   Chief Complaint  Patient presents with   Chest Pain   Headache    HPI: Michelle Strickland is a 36 y.o. adult with medical history significant of obesity    Presented with chest pain headache When patient was having an argument with their significant other Experienced associated chest pain which was severe feeling anxious tachypneic tearful. Associated with a headache and every time they would get upset will get a headache Reports still chest pain underneath her right breast BP 145/111   Patient reports after the had a significant argument they developed a headache and chest pain they called the mom saying that they feel like they are having a heart attack chest pain associated with diaphoresis.  Family told him to go to emergency department Not associated shortness of breath.  Chest pain has now resolved but does come back on occasion weak and dull sensation.  Patient never had any cardiac work-up prior to this.  Multiple family members with CHF.  Patient drinks 3-4 mixed drinks per day does not smoke denies drug abuse No neurological complaints    Regarding pertinent Chronic problems:     Morbid obesity-   BMI Readings from Last 1 Encounters:  07/31/22 45.19 kg/m   History of PTSD depression schizoaffective disorder. Has been off of medications for about a month or so.  While in ER: Clinical Course as of 07/31/22 2146  [facility] Jul 31, 2022  1746 Narcisse [CP]    Clinical Course User Index [CP] [name], PA-C    CXR -  NON acute    Following Medications were ordered in ER: Medications  nitroGLYCERIN (NITROSTAT) SL tablet 0.4 mg (0.4 mg Sublingual Given 07/31/22 1634)  pantoprazole  (PROTONIX) EC tablet 40 mg (has no administration in time range)  QUEtiapine (SEROQUEL) tablet 25 mg (has no administration in time range)  venlafaxine XR (EFFEXOR-XR) 24 hr capsule 37.5 mg (has no administration in time range)  0.9 %  sodium chloride infusion (has no administration in time range)  acetaminophen (TYLENOL) tablet 650 mg (has no administration in time range)    Or  acetaminophen (TYLENOL) suppository 650 mg (has no administration in time range)  HYDROcodone-acetaminophen (NORCO/VICODIN) 5-325 MG per tablet 1-2 tablet (1 tablet Oral Given 07/31/22 2103)  enoxaparin (LOVENOX) injection 60 mg (has no administration in time range)  morphine (PF) 2 MG/ML injection 2 mg (has no administration in time range)  aspirin EC tablet 81 mg (has no administration in time range)  morphine (PF) 2 MG/ML injection 2 mg (2 mg Intravenous Given 07/31/22 1607)  morphine (PF) 2 MG/ML injection 2 mg (2 mg Intravenous Given 07/31/22 1806)  sodium chloride 0.9 % bolus 1,000 mL (0 mLs Intravenous Stopped 07/31/22 1948)  acetaminophen (TYLENOL) tablet 650 mg (650 mg Oral Given 07/31/22 1808)    _______________________________________________________ ER Provider Called:    Cardiology  They Recommend admit to medicine   Will see in AM      ED Triage Vitals  Enc Vitals Group     BP 07/31/22 1313 130/88     Pulse Rate 07/31/22 1313 (!) 58     Resp 07/31/22 1313 (!) 21  Temp 07/31/22 1313 98 F (36.7 C)     Temp Source 07/31/22 1313 Oral     SpO2 07/31/22 1313 98 %     Weight 07/31/22 1327 280 lb (127 kg)     Height 07/31/22 1327  (1.676 m)     Head Circumference --      Peak Flow --      Pain Score 07/31/22 1326 6     Pain Loc --      Pain Edu? --      Excl. in GC? --   TMAX(24)@     _________________________________________ Significant initial  Findings: Abnormal Labs Reviewed  BASIC METABOLIC PANEL - Abnormal; Notable for the following components:      Result Value   Creatinine, Ser 1.08  (*)    All other components within normal limits  CBC - Abnormal; Notable for the following components:   RBC 5.13 (*)    Hemoglobin 15.7 (*)    HCT 48.7 (*)    All other components within normal limits  TROPONIN I (HIGH SENSITIVITY) - Abnormal; Notable for the following components:   Troponin I (High Sensitivity) 32 (*)    All other components within normal limits  TROPONIN I (HIGH SENSITIVITY) - Abnormal; Notable for the following components:   Troponin I (High Sensitivity) 29 (*)    All other components within normal limits     _________________________ Troponin 32 29 ECG: Ordered Personally reviewed and interpreted by me showing: HR : 72 Rhythm: Sinus rhythm LAE, consider biatrial enlargement Borderline right axis deviation Borderline T wave abnormalities QTC 400     The recent clinical data is shown below. Vitals:   07/31/22 2015 07/31/22 2105 07/31/22 2109 07/31/22 2133  BP: (!) 134/99 (!) 139/109  (!) 141/101  Pulse: 76 70  (!) 58  Resp: 19 15  (!) 21  Temp:   98 F (36.7 C) 98.6 F (37 C)  TempSrc:   Oral Oral  SpO2: 96% 98%  99%  Weight:      Height:            WBC     Component Value Date/Time   WBC 5.8 07/31/2022 1332       UA  ordered       _______________________________________________ Hospitalist was called for admission for CP   The following Work up has been ordered so far:  Orders Placed This Encounter  Procedures   DG Chest 2 View   Basic metabolic panel   CBC   Pregnancy, urine   D-dimer, quantitative   Rapid urine drug screen (hospital performed)   CK   Phosphorus   Magnesium   TSH   Urinalysis, Complete w Microscopic   HIV Antibody (routine testing w rflx)   Comprehensive metabolic panel   CBC   Diet Heart Room service appropriate? Yes; Fluid consistency: Thin   Document Height and Actual Weight   Cardiac monitoring   Patient has an active order for admit to inpatient/place in observation   Vital signs   Notify  physician (specify)   Progressive Mobility Protocol: No Restrictions   If patient diabetic or glucose greater than 140 notify physician for Sliding Scale Insulin Orders   Oral care per nursing protocol   Initiate Oral Care Protocol   Initiate Carrier Fluid Protocol   Assess   Check Pulse Oximetry while ambulating   Place order for blood cultures x 2 (from different sites) for Temp > 101F   RN may  order General Admission PRN Orders utilizing "General Admission PRN medications" (through manage orders) for the following patient needs: allergy symptoms (Claritin), cold sores (Carmex), cough (Robitussin DM), eye irritation (Liquifilm Tears), hemorrhoids (Tucks), indigestion (Maalox), minor skin irritation (Hydrocortisone Cream), muscle pain Romeo Apple Gay), nose irritation (saline nasal spray) and sore throat (Chloraseptic spray).   Cardiac Monitoring - Continuous Indefinite   Full code   Inpatient consult to Cardiology   Consult to hospitalist   Nutritional services consult   May transport without cardiac monitor   Pulse oximetry check with vital signs   Oxygen therapy Mode or (Route): Nasal cannula; Liters Per Minute: 2; Keep 02 saturation: greater than 92 %   Incentive spirometry   Pulse oximetry, continuous   ED EKG   EKG 12-Lead   EKG 12-Lead   EKG 12-Lead   EKG 12-Lead   ECHOCARDIOGRAM COMPLETE   Place in observation (patient's expected length of stay will be less than 2 midnights)     OTHER Significant initial  Findings:  labs showing:    Recent Labs  Lab 07/31/22 1332 07/31/22 2147  NA 139  --   K 4.5  --   CO2 22  --   GLUCOSE 91  --   BUN 12  --   CREATININE 1.08*  --   CALCIUM 9.0  --   MG  --  1.9  PHOS  --  2.2*    Cr   Up from baseline see below Lab Results  Component Value Date   CREATININE 1.08 (H) 07/31/2022   CREATININE 0.79 01/31/2019     Lab Results  Component Value Date   CALCIUM 9.0 07/31/2022        Plt: Lab Results  Component Value Date    PLT 240 07/31/2022      COVID-19 Labs  Recent Labs    07/31/22 1332  DDIMER 0.35    Lab Results  Component Value Date   SARSCOV2NAA NEGATIVE 05/19/2022       Recent Labs  Lab 07/31/22 1332  WBC 5.8  HGB 15.7*  HCT 48.7*  MCV 94.9  PLT 240    HG/HCT  stable,     Component Value Date/Time   HGB 15.7 (H) 07/31/2022 1332   HCT 48.7 (H) 07/31/2022 1332   MCV 94.9 07/31/2022 1332       Cultures: No results found for: "SDES", "SPECREQUEST", "CULT", "REPTSTATUS"   Radiological Exams on Admission: CT HEAD WO CONTRAST ( )  Result Date: 07/31/2022 CLINICAL DATA:  Headache, sudden, severe EXAM: CT HEAD WITHOUT CONTRAST TECHNIQUE: Contiguous axial images were obtained from the base of the skull through the vertex without intravenous contrast. RADIATION DOSE REDUCTION: This exam was performed according to the departmental dose-optimization program which includes automated exposure control, adjustment of the mA and/or kV according to patient size and/or use of iterative reconstruction technique. COMPARISON:  None Available. FINDINGS: Brain: Normal anatomic configuration. No abnormal intra or extra-axial mass lesion or fluid collection. No abnormal mass effect or midline shift. No evidence of acute intracranial hemorrhage or infarct. Ventricular size is normal. Cerebellum unremarkable. Vascular: Unremarkable Skull: Intact Sinuses/Orbits: Paranasal sinuses are clear. Orbits are unremarkable. Other: Mastoid air cells and middle ear cavities are clear. IMPRESSION: No acute intracranial abnormality. Electronically Signed   By: Helyn Numbers M.D.   On: 07/31/2022 23:16   DG Chest 2 View  Result Date: 07/31/2022 CLINICAL DATA:  Chest pain.  Headache. EXAM: CHEST - 2 VIEW COMPARISON:  None Available. FINDINGS: Cardiac silhouette  is normal in size. No mediastinal or hilar masses. No evidence of adenopathy. Clear lungs.  No pleural effusion or pneumothorax. Skeletal structures are unremarkable.  IMPRESSION: No active cardiopulmonary disease. Electronically Signed   By: Amie Portland M.D.   On: 07/31/2022 13:57   _______________________________________________________________________________________________________ Latest  Blood pressure (!) 141/101, pulse (!) 58, temperature 98.6 F (37 C), temperature source Oral, resp. rate (!) 21, height 5\' 6"  (1.676 m), weight 127 kg, SpO2 99 %.   Vitals  labs and radiology finding personally reviewed  Review of Systems:    Pertinent positives include:  chest pain,   headaches Constitutional:  No weight loss, night sweats, Fevers, chills, fatigue, weight loss  HEENT:  No, Difficulty swallowing,Tooth/dental problems,Sore throat,  No sneezing, itching, ear ache, nasal congestion, post nasal drip,  Cardio-vascular:  No Orthopnea, PND, anasarca, dizziness, palpitations.no Bilateral lower extremity swelling  GI:  No heartburn, indigestion, abdominal pain, nausea, vomiting, diarrhea, change in bowel habits, loss of appetite, melena, blood in stool, hematemesis Resp:  no shortness of breath at rest. No dyspnea on exertion, No excess mucus, no productive cough, No non-productive cough, No coughing up of blood.No change in color of mucus.No wheezing. Skin:  no rash or lesions. No jaundice GU:  no dysuria, change in color of urine, no urgency or frequency. No straining to urinate.  No flank pain.  Musculoskeletal:  No joint pain or no joint swelling. No decreased range of motion. No back pain.  Psych:  No change in mood or affect. No depression or anxiety. No memory loss.  Neuro: no localizing neurological complaints, no tingling, no weakness, no double vision, no gait abnormality, no slurred speech, no confusion  All systems reviewed and apart from HOPI all are negative _______________________________________________________________________________________________ Past Medical History:   Past Medical History:  Diagnosis Date   Asthma     Bronchitis    Chronic diarrhea    COPD (chronic obstructive pulmonary disease) (HCC)    Depression    Environmental and seasonal allergies    Family history of adverse reaction to anesthesia    " My sister vomited and aspirated during surgery; they gave her too much anesthesia"   Fatty liver    H/O heartburn    Morbid obesity (HCC)    Osteoarthritis of right acromioclavicular joint    PTSD (post-traumatic stress disorder)    Schizophrenia (HCC)    Tendinosis of rotator cuff    impingement   Wears glasses       Past Surgical History:  Procedure Laterality Date   CHOLECYSTECTOMY     COLONOSCOPY     SHOULDER ARTHROSCOPY Right 01/31/2019   Procedure: RIGHT SHOULDER ARTHROSCOPIC DEBRIDEMENT ROTATOR CUFF; SUBACROMIAL DECOMPRESSION; DISTAL CLAVICLE EXCISION;  Surgeon: 04/01/2019, MD;  Location: [facility] OR;  Service: Orthopedics;  Laterality: Right;   WISDOM TOOTH EXTRACTION      Social History:  Ambulatory   independently       reports that he has never smoked. He has never used smokeless tobacco. He reports current alcohol use. He reports that he does not currently use drugs.     Family History:   CHF in multiple family memebers ______________________________________________________________________________________________ Allergies: Allergies  Allergen Reactions   Contrast Media [Iodinated Contrast Media] Nausea And Vomiting   Penicillins Hives, Itching and Other (See Comments)    Did it involve swelling of the face/tongue/throat, SOB, or low BP? No Did it involve sudden or severe rash/hives, skin peeling, or any reaction on the inside of your mouth  or nose? Yes Did you need to seek medical attention at a hospital or doctor's office? Yes When did it last happen?      Childhood allergy If all above answers are "NO", may proceed with cephalosporin use.    Toradol [Ketorolac Tromethamine] Nausea Only and Other (See Comments)    migraines   Tramadol Other (See Comments)     migraines   Nsaids    Watermelon [Citrullus Vulgaris] Other (See Comments)    Throat itches     Prior to Admission medications   Medication Sig Start Date End Date Taking? Authorizing Provider  diclofenac Sodium (VOLTAREN) 1 % GEL Apply 2 g topically 4 (four) times daily. Patient taking differently: Apply 2 g topically 4 (four) times daily as needed (pain). 07/24/22  Yes Pricilla Loveless, MD  lamoTRIgine (LAMICTAL) 25 MG tablet Take 25 mg by mouth daily. 04/25/22  Yes [provider]  omeprazole (PRILOSEC) 20 MG capsule Take 20 mg by mouth daily as needed (heartburn). 06/22/22  Yes [provider]  ondansetron (ZOFRAN-ODT) 8 MG disintegrating tablet Take 8 mg by mouth every 8 (eight) hours as needed for nausea or vomiting. 06/22/22  Yes [provider]  promethazine (PHENERGAN) 25 MG tablet Take 25 mg by mouth every 6 (six) hours as needed for vomiting or nausea. 06/23/22  Yes [provider]  QUEtiapine (SEROQUEL) 25 MG tablet Take 25 mg by mouth at bedtime. 05/17/22  Yes [provider]  testosterone enanthate (DELATESTRYL) 200 MG/ML injection Inject 200 mg into the muscle every 7 (seven) days. For IM use only   Yes [provider]  venlafaxine XR (EFFEXOR-XR) 37.5 MG 24 hr capsule Take 37.5 mg by mouth every morning. 06/09/22  Yes [provider]  cyclobenzaprine (FLEXERIL) 10 MG tablet Take 1 tablet (10 mg total) by mouth at bedtime as needed for muscle spasms. Do not drink alcohol or drive while taking this medication.  May cause drowsiness. Patient not taking: Reported on 07/31/2022 07/17/22   Particia Nearing, PA-C  naproxen (NAPROSYN) 375 MG tablet Take 1 tablet (375 mg total) by mouth 2 (two) times daily. Patient not taking: Reported on 07/31/2022 07/24/22   Pricilla Loveless, MD    ___________________________________________________________________________________________________ Physical Exam:    07/31/2022    9:33 PM 07/31/2022     9:05 PM 07/31/2022    8:15 PM  Vitals with BMI  Systolic 141 139 009  Diastolic 101 109 99  Pulse 58 70 76     1. General:  in No  Acute distress   well   -appearing 2. Psychological: Alert and   Oriented 3. Head/ENT:   Moist   Mucous Membranes                          Head Non traumatic, neck supple                             Poor Dentition 4. SKIN: normal   Skin turgor,  Skin clean Dry and intact no rash 5. Heart: Regular rate and rhythm no  Murmur, no Rub or gallop 6. Lungs:  Clear to auscultation bilaterally, no wheezes or crackles   7. Abdomen: Soft,  non-tender, Non distended   obese  bowel sounds present 8. Lower extremities: no clubbing, cyanosis, no  edema 9. Neurologically strength 5 out of 5 in all 4 extremities cranial nerves II through XII intact  10. MSK: Normal range of motion    Chart has been reviewed  ______________________________________________________________________________________________  Assessment/Plan 36 y.o. adult with medical history significant of obesity  Admitted for headache chest pain elevated blood pressure elevated troponin  Present on Admission:  Chest pain  Elevated troponin  Elevated BP without diagnosis of hypertension  Schizoaffective disorder, bipolar type (HCC)  Headache  Alcohol abuse  Hypophosphatemia     Morbid obesity with BMI of 45.0-49.9, adult (HCC) Will need outpatient follow-up with nutrition  Chest pain Chest brain stress-induced. Troponins flat EKG nonischemic Discussed with cardiology who recommended admission To continue cycle cardiac enzymes obtain echo and may need a stress test Check D-dimer wnl Continue to monitor on telemetry appreciate cardiology insight  Elevated troponin Mild flat trend Continue to trend echo in the morning.  EKG nonischemic  Elevated BP without diagnosis of hypertension Continue to monitor treat as needed patient will need to get follow-up as a primary care provider if does  not improve  Schizoaffective disorder, bipolar type (HCC) Restart home medications including Effexor and Seroquel  Headache Given severe headache in the beginning will obtain CT head.  Neurologically intact continue to monitor  Alcohol abuse Monitor for any sign of withdrawal Can start on CIWA protocol given hypertension  Hypophosphatemia will replace and recheck in AM   Other plan as per orders.  DVT prophylaxis:  SCD       Code Status:    Code Status: Full Code FULL CODE   as per patient   I had personally discussed CODE STATUS with patient      Family Communication:   Family not at  Bedside    Disposition Plan:      To home once workup is complete and patient is stable   Following barriers for discharge:                                                      Pain controlled with PO medications                                                       Will need consultants to evaluate patient prior to discharge                      Consults called: cardiology is aware  Admission status:  ED Disposition     ED Disposition  Admit   Condition  --   Comment  Hospital Area: [facility] [[id number]]  Level of Care: Progressive [102]  Admit to Progressive based on following criteria: CARDIOVASCULAR & THORACIC of moderate stability with acute coronary syndrome symptoms/low risk myocardial infarction/hypertensive urgency/arrhythmias/heart failure potentially compromising stability and stable post cardiovascular intervention patients.  May place patient in observation at [facility] or [facility] if equivalent level of care is available:: No  Covid Evaluation: Asymptomatic - no recent exposure (last 10 days) testing not required  Diagnosis: Chest pain [[id number]]  Admitting Physician: Therisa Doyne [[id number]]  Attending Physician: Therisa Doyne [[id number]]           Obs      Level of care  progressive tele indefinitely please discontinue once  patient no longer qualifies COVID-19 Labs    Marise Knapper 07/31/2022, 11:29 PM    [facility]     after 2 AM please page floor coverage PA If 7AM-7PM, please contact the day team taking care of the patient using [email or web address]   Patient was evaluated in the context of the global COVID-19 pandemic, which necessitated consideration that the patient might be at risk for infection with the SARS-CoV-2 virus that causes COVID-19. Institutional protocols and algorithms that pertain to the evaluation of patients at risk for COVID-19 are in a state of rapid change based on information released by regulatory bodies including the CDC and federal and state organizations. These policies and algorithms were followed during the patient's care.

## 2022-07-31 NOTE — Assessment & Plan Note (Signed)
will replace and recheck in AM

## 2022-07-31 NOTE — ED Notes (Signed)
Pt called to RN to report new sudden-onset severe 10/10 right-sided sharp chest pain with anxiety, tachypnea, tearful on assessment. RN obtained new EKG and provided to Dr. Dayna Ramus. Informed PA of pt's new pain and awaiting orders. Pt's family remains at bedside and pt is on cardiac monitor per protocol. Vitals currently WNL

## 2022-08-01 ENCOUNTER — Observation Stay (HOSPITAL_BASED_OUTPATIENT_CLINIC_OR_DEPARTMENT_OTHER): Payer: 59

## 2022-08-01 ENCOUNTER — Encounter (HOSPITAL_COMMUNITY): Payer: Self-pay | Admitting: Internal Medicine

## 2022-08-01 DIAGNOSIS — Z79899 Other long term (current) drug therapy: Secondary | ICD-10-CM | POA: Diagnosis not present

## 2022-08-01 DIAGNOSIS — R778 Other specified abnormalities of plasma proteins: Secondary | ICD-10-CM | POA: Diagnosis not present

## 2022-08-01 DIAGNOSIS — R079 Chest pain, unspecified: Secondary | ICD-10-CM

## 2022-08-01 DIAGNOSIS — I429 Cardiomyopathy, unspecified: Secondary | ICD-10-CM | POA: Diagnosis not present

## 2022-08-01 DIAGNOSIS — J449 Chronic obstructive pulmonary disease, unspecified: Secondary | ICD-10-CM | POA: Diagnosis not present

## 2022-08-01 DIAGNOSIS — J45909 Unspecified asthma, uncomplicated: Secondary | ICD-10-CM | POA: Diagnosis not present

## 2022-08-01 LAB — ECHOCARDIOGRAM COMPLETE
Area-P 1/2: 3.31 cm2
Calc EF: 56.1 %
Height: 66 in
S' Lateral: 3.6 cm
Single Plane A2C EF: 54.3 %
Single Plane A4C EF: 60.2 %
Weight: 4480 oz

## 2022-08-01 LAB — LIPID PANEL
Cholesterol: 153 mg/dL (ref 0–200)
HDL: 35 mg/dL — ABNORMAL LOW (ref >40)
LDL Cholesterol: 104 mg/dL — ABNORMAL HIGH (ref 0–99)
Total CHOL/HDL Ratio: 4.4 RATIO
Triglycerides: 69 mg/dL (ref <150)
VLDL: 14 mg/dL (ref 0–40)

## 2022-08-01 LAB — PHOSPHORUS: Phosphorus: 3.8 mg/dL (ref 2.5–4.6)

## 2022-08-01 LAB — COMPREHENSIVE METABOLIC PANEL
ALT: 24 U/L (ref 0–44)
AST: 30 U/L (ref 15–41)
Albumin: 3.1 g/dL — ABNORMAL LOW (ref 3.5–5.0)
Alkaline Phosphatase: 49 U/L (ref 38–126)
Anion gap: 3 — ABNORMAL LOW (ref 5–15)
BUN: 14 mg/dL (ref 6–20)
CO2: 23 mmol/L (ref 22–32)
Calcium: 8 mg/dL — ABNORMAL LOW (ref 8.9–10.3)
Chloride: 114 mmol/L — ABNORMAL HIGH (ref 98–111)
Creatinine, Ser: 1 mg/dL (ref 0.44–1.00)
GFR, Estimated: >60 mL/min (ref >60)
Glucose, Bld: 101 mg/dL — ABNORMAL HIGH (ref 70–99)
Potassium: 4.3 mmol/L (ref 3.5–5.1)
Sodium: 140 mmol/L (ref 135–145)
Total Bilirubin: 0.7 mg/dL (ref 0.3–1.2)
Total Protein: 5.9 g/dL — ABNORMAL LOW (ref 6.5–8.1)

## 2022-08-01 LAB — CBC
HCT: 41.8 % (ref 36.0–46.0)
Hemoglobin: 13.2 g/dL (ref 12.0–15.0)
MCH: 30.5 pg (ref 26.0–34.0)
MCHC: 31.6 g/dL (ref 30.0–36.0)
MCV: 96.5 fL (ref 80.0–100.0)
Platelets: 201 10*3/uL (ref 150–400)
RBC: 4.33 MIL/uL (ref 3.87–5.11)
RDW: 15.5 % (ref 11.5–15.5)
WBC: 5.1 10*3/uL (ref 4.0–10.5)
nRBC: 0 % (ref 0.0–0.2)

## 2022-08-01 LAB — HEMOGLOBIN A1C
Hgb A1c MFr Bld: 4.9 % (ref 4.8–5.6)
Mean Plasma Glucose: 93.93 mg/dL

## 2022-08-01 LAB — HIV ANTIBODY (ROUTINE TESTING W REFLEX): HIV Screen 4th Generation wRfx: NONREACTIVE

## 2022-08-01 MED ORDER — METOPROLOL TARTRATE 50 MG PO TABS
100.0000 mg | ORAL_TABLET | Freq: Once | ORAL | Status: DC
  Filled 2022-08-01: qty 2

## 2022-08-01 MED ORDER — PERFLUTREN LIPID MICROSPHERE
1.0000 mL | INTRAVENOUS | Status: AC | PRN
  Administered 2022-08-01: 2 mL via INTRAVENOUS

## 2022-08-01 MED ORDER — ONDANSETRON HCL 4 MG/2ML IJ SOLN
4.0000 mg | Freq: Once | INTRAMUSCULAR | Status: AC
  Administered 2022-08-02: 4 mg via INTRAVENOUS
  Filled 2022-08-01 (×2): qty 2

## 2022-08-01 NOTE — Consult Note (Signed)
Cardiology Consultation:   Patient ID: Michelle Strickland MRN: [id number]; DOB: [birth year]  Admit date: 07/31/2022 Date of Consult: 08/01/2022  PCP:  Oneita Hurt, No   [facility] Providers Cardiologist:  None        Patient Profile:   Michelle Strickland is a 36 y.o. adult with a hx of hx of diastolic HF, asthma, schizoaffective disorder, PTSD,GERD, GI BLeed, morbid obesity, and ETOH abuse who is being seen 08/01/2022 for the evaluation of chest pain at the request of Dr. Rito Ehrlich.  History of Present Illness:   Mr. Michelle Strickland with PMH as above,  presented to ER 07/31/22 with chest pain.  He is in female to female transition and on exogenous testosterone.   FH of early cardiac death per pt. And CHF.  Hx of echo in 2019 with EF 55-60% RV normal. AL mildly dilated.  Valves normal.   Prior to arrival he was arguing with significant other and had chest pain, and anxiety and tachypneic.  Associated with a headache and diaphoresis. + SOB, no syncope.  No tobacco use, + ETOH 3-4 mixed drinks per day.    Increase of pain with deep breath.  Pain to Rt of sternum and begins Rt axillary region and radiates to Rt sternum.   Still has some present.  He felt the NTG helped but developed severe headache.  Rec'd Morphine as well.   Previously seen by [facility] for diastolic dysfunction in bout with alcohol binge.  ECHO 01/23/18 -- normal LV function, diastolic dysfunction, no valvular disease.     Hs troponin 32,29,31 CK 159 Na 140, K+ 4.3, BUN 14, Cr 1.0 Mg+ 1.9, albumin 3.1 WBC 5.1 Hgb 41.8 plts 201. Neg preg test, TSH 2.306  Ddimer 0.35  CT head no acute abnormality 2V CXR  No active cardio pulmonary disease.  EKG:  The EKG was personally reviewed and demonstrates:  SR with inverted T waves in V1-2. Rt ward axis no acute ST changes.  Telemetry:  Telemetry was personally reviewed and demonstrates:  SR  BP 130/103 to 130/99 P 65 R 16 T 98.2  Past Medical History:  Diagnosis Date   Asthma    Bronchitis     Chronic diarrhea    COPD (chronic obstructive pulmonary disease) (HCC)    Depression    Environmental and seasonal allergies    Family history of adverse reaction to anesthesia    " My sister vomited and aspirated during surgery; they gave her too much anesthesia"   Fatty liver    H/O heartburn    Morbid obesity (HCC)    Osteoarthritis of right acromioclavicular joint    PTSD (post-traumatic stress disorder)    Schizophrenia (HCC)    Tendinosis of rotator cuff    impingement   Wears glasses     Past Surgical History:  Procedure Laterality Date   CHOLECYSTECTOMY     COLONOSCOPY     SHOULDER ARTHROSCOPY Right 01/31/2019   Procedure: RIGHT SHOULDER ARTHROSCOPIC DEBRIDEMENT ROTATOR CUFF; SUBACROMIAL DECOMPRESSION; DISTAL CLAVICLE EXCISION;  Surgeon: Jones Broom, MD;  Location: [facility] OR;  Service: Orthopedics;  Laterality: Right;   WISDOM TOOTH EXTRACTION       Prior to Admission medications   Medication Sig Start Date End Date Taking? Authorizing Provider  diclofenac Sodium (VOLTAREN) 1 % GEL Apply 2 g topically 4 (four) times daily. Patient taking differently: Apply 2 g topically 4 (four) times daily as needed (pain). 07/24/22   Yes Pricilla Loveless, MD  lamoTRIgine (LAMICTAL) 25 MG  tablet Take 25 mg by mouth daily. 04/25/22   Yes [provider]  omeprazole (PRILOSEC) 20 MG capsule Take 20 mg by mouth daily as needed (heartburn). 06/22/22   Yes [provider]  ondansetron (ZOFRAN-ODT) 8 MG disintegrating tablet Take 8 mg by mouth every 8 (eight) hours as needed for nausea or vomiting. 06/22/22   Yes [provider]  promethazine (PHENERGAN) 25 MG tablet Take 25 mg by mouth every 6 (six) hours as needed for vomiting or nausea. 06/23/22   Yes [provider]  QUEtiapine (SEROQUEL) 25 MG tablet Take 25 mg by mouth at bedtime. 05/17/22   Yes [provider]  testosterone enanthate (DELATESTRYL) 200 MG/ML injection Inject 200 mg into the muscle every  7 (seven) days. For IM use only     Yes [provider]  venlafaxine XR (EFFEXOR-XR) 37.5 MG 24 hr capsule Take 37.5 mg by mouth every morning. 06/09/22   Yes [provider]  cyclobenzaprine (FLEXERIL) 10 MG tablet Take 1 tablet (10 mg total) by mouth at bedtime as needed for muscle spasms. Do not drink alcohol or drive while taking this medication.  May cause drowsiness. Patient not taking: Reported on 07/31/2022 07/17/22     Volney American, PA-C  naproxen (NAPROSYN) 375 MG tablet Take 1 tablet (375 mg total) by mouth 2 (two) times daily. Patient not taking: Reported on 07/31/2022 07/24/22     Sherwood Gambler, MD     Inpatient Medications: Scheduled Meds:  aspirin EC  81 mg Oral Daily   folic acid  1 mg Oral Daily   multivitamin with minerals  1 tablet Oral Daily   pantoprazole  40 mg Oral Daily   QUEtiapine  25 mg Oral QHS   thiamine  100 mg Oral Daily   Or   thiamine  100 mg Intravenous Daily   venlafaxine XR  37.5 mg Oral q morning   Continuous Infusions:  sodium chloride 75 mL/hr at 07/31/22 2313   PRN Meds: acetaminophen **OR** acetaminophen, HYDROcodone-acetaminophen, hydrOXYzine, LORazepam **OR** LORazepam, morphine injection, nitroGLYCERIN  Allergies:    Allergies  Allergen Reactions   Contrast Media [Iodinated Contrast Media] Nausea And Vomiting   Penicillins Hives, Itching and Other (See Comments)    Did it involve swelling of the face/tongue/throat, SOB, or low BP? No Did it involve sudden or severe rash/hives, skin peeling, or any reaction on the inside of your mouth or nose? Yes Did you need to seek medical attention at a hospital or doctor's office? Yes When did it last happen?      Childhood allergy If all above answers are "NO", may proceed with cephalosporin use.    Toradol [Ketorolac Tromethamine] Nausea Only and Other (See Comments)    migraines   Tramadol Other (See Comments)    migraines   Nsaids    Watermelon [Citrullus Vulgaris]  Other (See Comments)    Throat itches    Social History:   Social History   Socioeconomic History   Marital status: Single    Spouse name: Not on file   Number of children: Not on file   Years of education: Not on file   Highest education level: Not on file  Occupational History   Not on file  Tobacco Use   Smoking status: Never   Smokeless tobacco: Never  Vaping Use   Vaping Use: Never used  Substance and Sexual Activity   Alcohol use: Yes    Comment: occasional   Drug use: Not  Currently   Sexual activity: Not on file  Other Topics Concern   Not on file  Social History Narrative   Not on file   Social Determinants of Health   Financial Resource Strain: Not on file  Food Insecurity: Not on file  Transportation Needs: Not on file  Physical Activity: Not on file  Stress: Not on file  Social Connections: Not on file  Intimate Partner Violence: Not on file    Family History:    Family History  Problem Relation Age of Onset   Heart disease Father    Heart disease Paternal Grandfather      ROS:  Please see the history of present illness.  General:no colds or fevers, no weight changes Skin:no rashes or ulcers HEENT:no blurred vision, no congestion CV:see HPI PUL:see HPI GI:no diarrhea constipation or melena though occ bright blood in stool, no indigestion- does develop hard are upper abd at times.  GU:no hematuria, no dysuria MS:no joint pain, no claudication Neuro:no syncope, no lightheadedness Endo:+ diabetes, no thyroid disease  All other ROS reviewed and negative.     Physical Exam/Data:   Vitals:   07/31/22 2109 07/31/22 2133 08/01/22 0109 08/01/22 0530  BP:  (!) 141/101 124/83 (!) 130/103  Pulse:  (!) 58 84 61  Resp:  (!) 21 16 18   Temp: 98 F (36.7 C) 98.6 F (37 C) (!) 97.5 F (36.4 C) 97.7 F (36.5 C)  TempSrc: Oral Oral Oral Oral  SpO2:  99% 94% 95%  Weight:      Height:        Intake/Output Summary (Last 24 hours) at 08/01/2022  0810 Last data filed at 08/01/2022 0400 Gross per 24 hour  Intake 1360 ml  Output --  Net 1360 ml      07/31/2022    1:27 PM 07/24/2022    5:37 PM 07/22/2022    6:40 PM  Last 3 Weights  Weight (lbs) 280 lb 280 lb 280 lb  Weight (kg) 127.007 kg 127.007 kg 127.007 kg     Body mass index is 45.19 kg/m.  General:  Well nourished, well developed, in no acute distress though still with pain HEENT: normal Neck: no JVD Vascular: No carotid bruits; Distal pulses 2+ bilaterally Cardiac:  normal S1, S2; RRR; no murmur gallup rub or click Lungs:  clear to auscultation bilaterally, no wheezing, rhonchi or rales  Abd: soft, nontender, no hepatomegaly  Ext: no edema Musculoskeletal:  No deformities, BUE and BLE strength normal and equal Skin: warm and dry  Neuro:  alert and oriented X 3 MAE follows commands, no focal abnormalities noted Psych:  Normal affect    Relevant CV Studies: Echo ordered  Echo 01/22/18 FINDINGS:   LEFT VENTRICLE  The left ventricular size is normal. There is normal left ventricular wall  thickness. Left ventricular systolic function is normal. LV ejection fraction  = 55-60%. Left ventricular filling pattern is prolonged relaxation. No  segmental wall motion abnormalities seen in the left ventricle.   RIGHT VENTRICLE  The right ventricle is normal in size and function.   LEFT ATRIUM  The left atrium is mildly dilated.   RIGHT ATRIUM   Right atrial size is normal.  AORTIC VALVE  The aortic valve is normal in structure and function. There is no aortic  regurgitation.  MITRAL VALVE  The mitral valve is normal in structure and function. There is no mitral  regurgitation noted.  TRICUSPID VALVE  The tricuspid valve is normal in structure  and function. There is trace  tricuspid regurgitation.  PULMONIC VALVE  Structurally normal pulmonic valve. There is no pulmonic valvular  regurgitation.  ARTERIES  The aortic root is normal size.  VENOUS  Pulmonary  venous flow pattern is normal. IVC size was normal.  EFFUSION  There is no pericardial effusion.  MMode/2D Measurements & Calculations  IVSd: 1.2 cm  LVIDd: 5.1 cm  LVPWd: 1.3 cm  LVIDs: 3.3 cm  LA dim: 4.6 cm  Ao root: 3.3 cm  EDV(MOD-sp4): 105.0 ml  IVS/LVPW: 0.92  FS: 33.9 %  EDV(Teich): 121.4 ml  ESV(Teich): 45.5 ml  ESV(MOD-sp4): 44.6 ml  EDV(MOD-sp2): 106.0 ml  ESV(MOD-sp2): 42.3 ml  EDV(cubed): 129.3 ml  ESV(cubed): 37.3 ml  EF(cubed): 71.1 %  LV mass(C)d: 255.7 grams  LV mass(C)dI: 105.3 grams/m2  SV(Teich): 75.9 ml  SI(Teich): 31.3 ml/m2  SV(cubed): 92.0 ml  SI(cubed): 37.9 ml/m2  Ao root area: 8.4 cm2  LA/Ao: 1.4  LVOT diam: 2.0 cm  LVOT area(traced): 3.1 cm2  SV(MOD-sp4): 60.4 ml  SI(MOD-sp4): 24.9 ml/m2  SV(MOD-sp2): 63.7 ml  SI(MOD-sp2): 26.2 ml/m2  Doppler Measurements & Calculations  MV E max vel: 83.9 cm/sec  MV A max vel: 68.9 cm/sec  MV E/A: 1.2  MV dec time: 0.14 sec  SV(LVOT): 80.1 ml  Ao V2 max: 146.6 cm/sec  Ao max PG: 8.6 mmHg  Ao max PG (full): 1.5 mmHg  Ao V2 mean: 104.8 cm/sec  Ao mean PG: 4.9 mmHg  Ao mean PG (full): 0.23 mmHg  Ao V2 VTI: 24.4 cm  AVA (VTI): 3.3 cm2  LV V1 VTI: 24.4 cm  SV(Ao): 204.9 ml  SI(Ao): 84.3 ml/m2  SI(LVOT): 33.0 ml/m2  AS Dimensionless Index (VTI): 1.0  AVAi(VTI) cm^2/m^2: 1.4 cm2  SV index(LVOT): 33.0 ml/m2    Laboratory Data:  High Sensitivity Troponin:   Recent Labs  Lab 07/31/22 1332 07/31/22 1532 07/31/22 2147  TROPONINIHS 32* 29* 31*     Chemistry Recent Labs  Lab 07/31/22 1332 07/31/22 2147 08/01/22 0415  NA 139  --  140  K 4.5  --  4.3  CL 110  --  114*  CO2 22  --  23  GLUCOSE 91  --  101*  BUN 12  --  14  CREATININE 1.08*  --  1.00  CALCIUM 9.0  --  8.0*  MG  --  1.9  --   GFRNONAA >60  --  >60  ANIONGAP 7  --  3*    Recent Labs  Lab 08/01/22 0415  PROT 5.9*  ALBUMIN 3.1*  AST 30  ALT 24  ALKPHOS 49  BILITOT 0.7   Lipids No results for input(s): "CHOL",  "TRIG", "HDL", "LABVLDL", "LDLCALC", "CHOLHDL" in the last 168 hours.  Hematology Recent Labs  Lab 07/31/22 1332 08/01/22 0415  WBC 5.8 5.1  RBC 5.13* 4.33  HGB 15.7* 13.2  HCT 48.7* 41.8  MCV 94.9 96.5  MCH 30.6 30.5  MCHC 32.2 31.6  RDW 15.5 15.5  PLT 240 201   Thyroid  Recent Labs  Lab 07/31/22 2147  TSH 2.306    BNPNo results for input(s): "BNP", "PROBNP" in the last 168 hours.  DDimer  Recent Labs  Lab 07/31/22 1332  DDIMER 0.35     Radiology/Studies:  CT HEAD WO CONTRAST (5MM)  Result Date: 07/31/2022 CLINICAL DATA:  Headache, sudden, severe EXAM: CT HEAD WITHOUT CONTRAST TECHNIQUE: Contiguous axial images were obtained from the base of the skull through the vertex without intravenous contrast.  RADIATION DOSE REDUCTION: This exam was performed according to the departmental dose-optimization program which includes automated exposure control, adjustment of the mA and/or kV according to patient size and/or use of iterative reconstruction technique. COMPARISON:  None Available. FINDINGS: Brain: Normal anatomic configuration. No abnormal intra or extra-axial mass lesion or fluid collection. No abnormal mass effect or midline shift. No evidence of acute intracranial hemorrhage or infarct. Ventricular size is normal. Cerebellum unremarkable. Vascular: Unremarkable Skull: Intact Sinuses/Orbits: Paranasal sinuses are clear. Orbits are unremarkable. Other: Mastoid air cells and middle ear cavities are clear. IMPRESSION: No acute intracranial abnormality. Electronically Signed   By: Fidela Salisbury M.D.   On: 07/31/2022 23:16   DG Chest 2 View  Result Date: 07/31/2022 CLINICAL DATA:  Chest pain.  Headache. EXAM: CHEST - 2 VIEW COMPARISON:  None Available. FINDINGS: Cardiac silhouette is normal in size. No mediastinal or hilar masses. No evidence of adenopathy. Clear lungs.  No pleural effusion or pneumothorax. Skeletal structures are unremarkable. IMPRESSION: No active cardiopulmonary  disease. Electronically Signed   By: Lajean Manes M.D.   On: 07/31/2022 13:57     Assessment and Plan:   Chest pain with argument, troponins flat, EKG without acute changes.   Now on ASA 81 mg daily  agree with echo to eval then- possible stress test, unable to do cCTA today, vs outpt PET scan to eval for CAD.  Dr. Harrell Gave to see. GERD on protonix  Schizoaffective disorder, bipolar type per IM Alcohol abuse.  We discussed stopping, he is aware of need to stop.     Risk Assessment/Risk Scores:     HEAR Score (for undifferentiated chest pain):             For questions or updates, please contact [place] Please consult [email or web address] for contact info under    Signed, Cecilie Kicks, NP  08/01/2022 8:10 AM

## 2022-08-01 NOTE — TOC Progression Note (Signed)
Transition of Care [facility]) - Progression Note    Patient Details  Name: BRITTYN SALAZ MRN: [id number] Date of Birth: 27-Dec-1985  Transition of Care [facility]) CM/SW Contact  Geni Bers, RN Phone Number: 08/01/2022, 1:48 PM  Clinical Narrative:      Transition of Care (TOC) Screening Note   Patient Details  Name: ALVERDA NAZZARO Date of Birth: 1986-04-05   Transition of Care [facility]) CM/SW Contact:    Geni Bers, RN Phone Number: 08/01/2022, 1:48 PM    Transition of Care Department [facility]) has reviewed patient and no TOC needs have been identified at this time. We will continue to monitor patient advancement through interdisciplinary progression rounds. If new patient transition needs arise, please place a TOC consult.         Expected Discharge Plan and Services                                                 Social Determinants of Health (SDOH) Interventions    Readmission Risk Interventions     No data to display

## 2022-08-01 NOTE — Progress Notes (Signed)
[facility] PROGRESS NOTE   Michelle Strickland DOB: 09/15/1986 DOA: 07/31/2022  PCP: Pcp, No  Brief History/Interval Summary: 36 year old adult with no significant past medical history except for obesity came in with chest pain and headache onset was while patient was having an argument with significant other.  Consultants: Cardiology  Procedures: Transthoracic echocardiogram    Subjective/Interval History: Patient mentions that his chest discomfort has significantly improved but still about 2-3 out of 10 in intensity.  Headache is also improved.  Denies any shortness of breath.  No acid reflux.  No abdominal pain.  No nausea or vomiting.   Assessment/Plan:  Chest pain/mildly elevated troponin Stress-induced.  Troponin levels abnormal but flat trend.  EKG nonischemic.  Echocardiogram is pending.  D-dimer was normal. Cardiology to evaluate patient.  Elevated blood pressure without diagnosis of hypertension Blood pressures have improved in the last 24 hours.  This is without use of any antihypertensives.  Recommend this be monitored closely in the outpatient setting.  Will not initiate any treatment at this time.  Headache Likely triggered by stress.  CT was unremarkable.  Schizoaffective disorder/bipolar  Continue home medications.  Alcohol abuse Started on CIWA protocol.  No evidence for withdrawal symptoms currently.  Hypophosphatemia Repleted.   Class III obesity Estimated body mass index is 45.19 kg/m as calculated from the following:   Height as of this encounter: 5\' 6"  (1.676 m).   Weight as of this encounter: 127 kg.   DVT Prophylaxis: SCDs Code Status: Full code Family Communication: Discussed with patient Disposition Plan: Hopefully return home when work-up is complete  Status is: Observation The patient remains OBS appropriate and may d/c before 2 midnights.      Medications: Scheduled:  aspirin EC  81 mg Oral Daily   folic  acid  1 mg Oral Daily   multivitamin with minerals  1 tablet Oral Daily   pantoprazole  40 mg Oral Daily   QUEtiapine  25 mg Oral QHS   thiamine  100 mg Oral Daily   Or   thiamine  100 mg Intravenous Daily   venlafaxine XR  37.5 mg Oral q morning   Continuous: **OR** acetaminophen, HYDROcodone-acetaminophen, hydrOXYzine, LORazepam **OR** LORazepam, morphine injection, nitroGLYCERIN  Antibiotics: Anti-infectives (From admission, onward)    None       Objective:  Vital Signs  Vitals:   07/31/22 2133 08/01/22 0109 08/01/22 0530 08/01/22 0938  BP: (!) 141/101 124/83 (!) 130/103 (!) 130/99  Pulse: (!) 58 84 61 72  Resp: (!) 21 16 18 16   Temp: 98.6 F (37 C) (!) 97.5 F (36.4 C) 97.7 F (36.5 C) 98.2 F (36.8 C)  TempSrc: Oral Oral Oral Oral  SpO2: 99% 94% 95% 97%  Weight:      Height:        Intake/Output Summary (Last 24 hours) at 08/01/2022 0952 Last data filed at 08/01/2022 0400 Gross per 24 hour  Intake 1360 ml  Output --  Net 1360 ml   Filed Weights   07/31/22 1327  Weight: 127 kg    General appearance: Awake alert.  In no distress Resp: Clear to auscultation bilaterally.  Normal effort Cardio: S1-S2 is normal regular.  No S3-S4.  No rubs murmurs or bruit GI: Abdomen is soft.  Nontender nondistended.  Bowel sounds are present normal.  No masses organomegaly Extremities: No edema.  Full range of motion of lower extremities. Neurologic: Alert and oriented x3.  No focal neurological deficits.  Lab Results:  Data Reviewed: I have personally reviewed following labs and reports of the imaging studies  CBC: Recent Labs  Lab 07/31/22 1332 08/01/22 0415  WBC 5.8 5.1  HGB 15.7* 13.2  HCT 48.7* 41.8  MCV 94.9 96.5  PLT 240 201    Basic Metabolic Panel: Recent Labs  Lab 07/31/22 1332 07/31/22 2147 08/01/22 0415  NA 139  --  140  K 4.5  --  4.3  CL 110  --  114*  CO2 22  --  23  GLUCOSE 91  --  101*  BUN 12  --  14  CREATININE  1.08*  --  1.00  CALCIUM 9.0  --  8.0*  MG  --  1.9  --   PHOS  --  2.2* 3.8    GFR: Estimated Creatinine Clearance (by C-G formula based on SCr of 1 mg/dL) Female: 269.4 mL/min Female: 129.9 mL/min  Liver Function Tests: Recent Labs  Lab 08/01/22 0415  AST 30  ALT 24  ALKPHOS 49  BILITOT 0.7  PROT 5.9*  ALBUMIN 3.1*     Cardiac Enzymes: Recent Labs  Lab 07/31/22 2147  CKTOTAL 159     Thyroid Function Tests: Recent Labs    07/31/22 2147  TSH 2.306     Radiology Studies: CT HEAD WO CONTRAST ( )  Result Date: 07/31/2022 CLINICAL DATA:  Headache, sudden, severe EXAM: CT HEAD WITHOUT CONTRAST TECHNIQUE: Contiguous axial images were obtained from the base of the skull through the vertex without intravenous contrast. RADIATION DOSE REDUCTION: This exam was performed according to the departmental dose-optimization program which includes automated exposure control, adjustment of the mA and/or kV according to patient size and/or use of iterative reconstruction technique. COMPARISON:  None Available. FINDINGS: Brain: Normal anatomic configuration. No abnormal intra or extra-axial mass lesion or fluid collection. No abnormal mass effect or midline shift. No evidence of acute intracranial hemorrhage or infarct. Ventricular size is normal. Cerebellum unremarkable. Vascular: Unremarkable Skull: Intact Sinuses/Orbits: Paranasal sinuses are clear. Orbits are unremarkable. Other: Mastoid air cells and middle ear cavities are clear. IMPRESSION: No acute intracranial abnormality. Electronically Signed   By: Helyn Numbers M.D.   On: 07/31/2022 23:16   DG Chest 2 View  Result Date: 07/31/2022 CLINICAL DATA:  Chest pain.  Headache. EXAM: CHEST - 2 VIEW COMPARISON:  None Available. FINDINGS: Cardiac silhouette is normal in size. No mediastinal or hilar masses. No evidence of adenopathy. Clear lungs.  No pleural effusion or pneumothorax. Skeletal structures are unremarkable. IMPRESSION: No  active cardiopulmonary disease. Electronically Signed   By: Amie Portland M.D.   On: 07/31/2022 13:57       LOS: 0 days   Michelle Strickland  [facility] Pager on [email or web address]  08/01/2022, 9:52 AM

## 2022-08-02 ENCOUNTER — Ambulatory Visit: Payer: 59 | Admitting: Podiatry

## 2022-08-02 ENCOUNTER — Observation Stay (HOSPITAL_BASED_OUTPATIENT_CLINIC_OR_DEPARTMENT_OTHER): Payer: 59

## 2022-08-02 DIAGNOSIS — Z6841 Body Mass Index (BMI) 40.0 and over, adult: Secondary | ICD-10-CM | POA: Diagnosis not present

## 2022-08-02 DIAGNOSIS — R079 Chest pain, unspecified: Secondary | ICD-10-CM | POA: Diagnosis not present

## 2022-08-02 DIAGNOSIS — R778 Other specified abnormalities of plasma proteins: Secondary | ICD-10-CM | POA: Diagnosis not present

## 2022-08-02 DIAGNOSIS — I429 Cardiomyopathy, unspecified: Secondary | ICD-10-CM | POA: Diagnosis not present

## 2022-08-02 MED ORDER — HYDROCODONE-ACETAMINOPHEN 5-325 MG PO TABS: 1.0000 | ORAL_TABLET | Freq: Four times a day (QID) | ORAL | 0 refills | Status: DC | PRN

## 2022-08-02 MED ORDER — LOSARTAN POTASSIUM 25 MG PO TABS: 12.5000 mg | ORAL_TABLET | Freq: Every day | ORAL | Status: DC

## 2022-08-02 MED ORDER — LOSARTAN POTASSIUM 25 MG PO TABS: 12.5000 mg | ORAL_TABLET | Freq: Every day | ORAL | 1 refills | Status: DC

## 2022-08-02 MED ORDER — THIAMINE HCL 100 MG PO TABS: 100.0000 mg | ORAL_TABLET | Freq: Every day | ORAL | 1 refills | Status: DC

## 2022-08-02 MED ORDER — IOHEXOL 350 MG/ML SOLN
100.0000 mL | Freq: Once | INTRAVENOUS | Status: AC | PRN
Start: 2022-08-02 — End: 2022-08-02
  Administered 2022-08-02: 100 mL via INTRAVENOUS

## 2022-08-02 MED ORDER — PANTOPRAZOLE SODIUM 40 MG PO TBEC: 40.0000 mg | DELAYED_RELEASE_TABLET | Freq: Every day | ORAL | 0 refills | Status: DC

## 2022-08-02 MED ORDER — METOPROLOL SUCCINATE ER 25 MG PO TB24
12.5000 mg | ORAL_TABLET | Freq: Every day | ORAL | Status: DC
  Administered 2022-08-02: 12.5 mg via ORAL
  Filled 2022-08-02: qty 1

## 2022-08-02 MED ORDER — METOPROLOL SUCCINATE ER 25 MG PO TB24: 12.5000 mg | ORAL_TABLET | Freq: Every day | ORAL | 1 refills | Status: DC

## 2022-08-02 MED ORDER — NITROGLYCERIN 0.4 MG SL SUBL
0.8000 mg | SUBLINGUAL_TABLET | SUBLINGUAL | Status: DC | PRN
  Administered 2022-08-02: 0.8 mg via SUBLINGUAL

## 2022-08-02 NOTE — Plan of Care (Signed)
  Problem: Clinical Measurements: Goal: Respiratory complications will improve Outcome: Adequate for Discharge   Problem: Activity: Goal: Risk for activity intolerance will decrease Outcome: Adequate for Discharge   Problem: Skin Integrity: Goal: Risk for impaired skin integrity will decrease Outcome: Adequate for Discharge   Problem: Pain Managment: Goal: General experience of comfort will improve Outcome: Adequate for Discharge

## 2022-08-02 NOTE — Progress Notes (Signed)
NUTRITION NOTE  Consult received from ED MD for assessment of nutrition requirements and status. Review of provider's initial note indicates provider interested in outpatient education regarding morbid obesity.   Patient is Observation status. Discharge order and summary entered already.   No additional inpatient RD needs identified at this time. If patient unable to d/c and needs arise, please consult RD.     Trenton Gammon, MS, RD, LDN, CNSC Registered Dietitian II Inpatient Clinical Nutrition RD pager # and on-call/weekend pager # available in [facility]

## 2022-08-02 NOTE — Discharge Summary (Signed)
[facility]  Physician Discharge Summary   Patient ID: Michelle Strickland MRN: [id number] DOB/AGE: [id number] y.o.  Admit date: 07/31/2022 Discharge date:   08/02/2022   PCP: Pcp, No  DISCHARGE DIAGNOSES:  Principal Problem:   Chest pain Active Problems:   Schizoaffective disorder, bipolar type (HCC)   Morbid obesity with BMI of 45.0-49.9, adult (HCC)   Elevated troponin   Elevated BP without diagnosis of hypertension   Headache   Alcohol abuse   Hypophosphatemia   RECOMMENDATIONS FOR OUTPATIENT FOLLOW UP: Cardiology to arrange outpatient follow-up   Home Health: None Equipment/Devices: None  CODE STATUS: Full code  DISCHARGE CONDITION: fair  Diet recommendation: Low-sodium heart healthy  INITIAL HISTORY: 36 year old adult with no significant past medical history except for obesity came in with chest pain and headache onset was while patient was having an argument with significant other.   Consultants: Cardiology   Procedures: Transthoracic echocardiogram    HOSPITAL COURSE:   Chest pain/mildly elevated troponin/chronic systolic CHF Troponin levels abnormal but flat trend.  EKG nonischemic.   Echocardiogram showed EF of 45% with global hypokinesis.  Patient seen by cardiology.  D-dimer was noted to be normal. LDL 104. Cardiology recommended coronary CTA which was done this morning.  No significant disease was noted.  Cardiology recommends low-dose beta-blocker and ARB which they have initiated.  No aspirin or statin for now.  They will discuss this further with the patient at outpatient follow-up.   Elevated blood pressure without diagnosis of hypertension Blood pressures have improved compared to the time of admission but still somewhat elevated.  Continue with beta-blocker and ARB as prescribed and discussed above.   Headache Likely triggered by stress.  CT was unremarkable.  Headache seems to have improved.  Schizoaffective disorder/bipolar   Continue home medications.   Alcohol abuse Did not experience any withdrawal in the hospital.  He was counseled.   Hypophosphatemia Repleted.   Class III obesity Estimated body mass index is 45.19 kg/m as calculated from the following:   Height as of this encounter: 5\' 6"  (1.676 m).   Weight as of this encounter: 127 kg.   Cardiology will arrange outpatient follow-up.  They have cleared him for discharge today.  Okay for discharge.   PERTINENT LABS:  The results of significant diagnostics from this hospitalization (including imaging, microbiology, ancillary and laboratory) are listed below for reference.     Labs:   Basic Metabolic Panel: Recent Labs  Lab 07/31/22 1332 07/31/22 2147 08/01/22 0415  NA 139  --  140  K 4.5  --  4.3  CL 110  --  114*  CO2 22  --  23  GLUCOSE 91  --  101*  BUN 12  --  14  CREATININE 1.08*  --  1.00  CALCIUM 9.0  --  8.0*  MG  --  1.9  --   PHOS  --  2.2* 3.8   Liver Function Tests: Recent Labs  Lab 08/01/22 0415  AST 30  ALT 24  ALKPHOS 49  BILITOT 0.7  PROT 5.9*  ALBUMIN 3.1*    CBC: Recent Labs  Lab 07/31/22 1332 08/01/22 0415  WBC 5.8 5.1  HGB 15.7* 13.2  HCT 48.7* 41.8  MCV 94.9 96.5  PLT 240 201   Cardiac Enzymes: Recent Labs  Lab 07/31/22 2147  CKTOTAL 159     IMAGING STUDIES CT CORONARY MORPH W/CTA COR W/SCORE W/CA W/CM &/OR WO/CM  Result Date: 08/02/2022 HISTORY: Chest pain EXAM: Cardiac/Coronary  CT TECHNIQUE: The patient was scanned on a [employer]. PROTOCOL: A 130 kV prospective scan was triggered in the descending thoracic aorta at 111 HU's. Axial non-contrast 3 mm slices were carried out through the heart. The data set was analyzed on a dedicated work station and scored using the Agatston method. Gantry rotation speed was 250 msecs and collimation was .6 mm. Heart rate was optimized medically and sl NTG was given. The 3D data set was reconstructed in 5% intervals of the 35-75 % of the R-R  cycle. Systolic and diastolic phases were analyzed on a dedicated work station using MPR, MIP and VRT modes. The patient received OMNIPAQUE IOHEXOL 350 MG/ML SOLN of contrast. FINDINGS: Coronary calcium score: The patient's coronary artery calcium score is 0, which places the patient in the 0 percentile. Coronary arteries: Normal coronary origins.  Right dominance. Right Coronary Artery: Normal caliber vessel, gives rise to PDA and PLB. No significant plaque or stenosis. Left Main Coronary Artery: Normal caliber vessel. No significant plaque or stenosis. Left Anterior Descending Coronary Artery: Normal caliber vessel. No significant plaque or stenosis. Gives rise to two small diagonal branches. Left Circumflex Artery: Normal caliber vessel. No significant plaque or stenosis. Gives rise to two small OM branches. Aorta: Normal size, 30 mm at the mid ascending aorta (level of the PA bifurcation) measured double oblique. No aortic atherosclerosis. No dissection seen in visualized portions of the aorta. Aortic Valve: No calcifications. Trileaflet. Other findings: Normal pulmonary vein drainage into the left atrium. Normal left atrial appendage. There is mixing artifact at distal LAA, without delayed imaging cannot evaluate/exclude thrombus. Dilated size of the pulmonary artery, measuring 35 mm. Normal appearance of the pericardium. IMPRESSION: 1. No evidence of CAD, CADRADS = 0. 2. Coronary calcium score of 0. This was 0 percentile for age and sex matched control. 3. Normal coronary origin with right dominance. 4. Dilated size of the pulmonary artery, measuring 35 mm. INTERPRETATION: 1. CAD-RADS 0: No evidence of CAD (0%). Consider non-atherosclerotic causes of chest pain. 2. CAD-RADS 1: Minimal non-obstructive CAD (0-24%). Consider non-atherosclerotic causes of chest pain. Consider preventive therapy and risk factor modification. 3. CAD-RADS 2: Mild non-obstructive CAD (25-49%). Consider non-atherosclerotic causes  of chest pain. Consider preventive therapy and risk factor modification. 4. CAD-RADS 3: Moderate stenosis (50-69%). Consider symptom-guided anti-ischemic pharmacotherapy as well as risk factor modification per guideline directed care. Additional analysis with CT FFR will be submitted. 5. CAD-RADS 4: Severe stenosis. (70-99% or > 50% left main). Cardiac catheterization or CT FFR is recommended. Consider symptom-guided anti-ischemic pharmacotherapy as well as risk factor modification per guideline directed care. Invasive coronary angiography recommended with revascularization per published guideline statements. 6. CAD-RADS 5: Total coronary occlusion (100%). Consider cardiac catheterization or viability assessment. Consider symptom-guided anti-ischemic pharmacotherapy as well as risk factor modification per guideline directed care. 7. CAD-RADS N: Non-diagnostic study. Obstructive CAD can't be excluded. Alternative evaluation is recommended. Electronically Signed   By: Jodelle Red M.D.   On: 08/02/2022 12:54   ECHOCARDIOGRAM COMPLETE  Result Date: 08/01/2022    ECHOCARDIOGRAM REPORT   Patient Name:   Michelle Strickland Date of Exam: 08/01/2022 Medical Rec #:  [id number]           Height:       66.0 in Accession #:    [id number]          Weight:       280.0 lb Date of Birth:  08/31/1986  BSA:          2.307 m Patient Age:    36 years            BP:           130/99 mmHg Patient Gender: F                   HR:           67 bpm. Exam Location:  Inpatient Procedure: 2D Echo, 3D Echo, Cardiac Doppler, Color Doppler and Intracardiac            Opacification Agent Indications:    R07.9* Chest pain, unspecified  History:        Patient has no prior history of Echocardiogram examinations.                 Signs/Symptoms:Chest Pain. ETOH.  Sonographer:    Sheralyn Boatman RDCS Referring Phys: [id number] ANASTASSIA DOUTOVA  Sonographer Comments: Technically difficult study due to poor echo windows and patient is morbidly  obese. Image acquisition challenging due to patient body habitus. Delay sending study IMPRESSIONS  1. Left ventricular ejection fraction by 3D volume is 45 %. The left ventricle has mildly decreased function. The left ventricle demonstrates global hypokinesis. There is mild left ventricular hypertrophy. Left ventricular diastolic parameters are consistent with Grade II diastolic dysfunction (pseudonormalization).  2. Right ventricular systolic function is normal. The right ventricular size is normal.  3. Left atrial size was mildly dilated.  4. The mitral valve is normal in structure. Trivial mitral valve regurgitation. No evidence of mitral stenosis.  5. The aortic valve is tricuspid. Aortic valve regurgitation is not visualized. No aortic stenosis is present.  6. The inferior vena cava is dilated in size with <50% respiratory variability, suggesting right atrial pressure of 15 mmHg. FINDINGS  Left Ventricle: Left ventricular ejection fraction by 3D volume is 45 %. The left ventricle has mildly decreased function. The left ventricle demonstrates global hypokinesis. Definity contrast agent was given IV to delineate the left ventricular endocardial borders. The left ventricular internal cavity size was normal in size. There is mild left ventricular hypertrophy. Left ventricular diastolic parameters are consistent with Grade II diastolic dysfunction (pseudonormalization). Right Ventricle: The right ventricular size is normal. No increase in right ventricular wall thickness. Right ventricular systolic function is normal. Left Atrium: Left atrial size was mildly dilated. Right Atrium: Right atrial size was normal in size. Pericardium: There is no evidence of pericardial effusion. Mitral Valve: The mitral valve is normal in structure. Trivial mitral valve regurgitation. No evidence of mitral valve stenosis. Tricuspid Valve: The tricuspid valve is normal in structure. Tricuspid valve regurgitation is not demonstrated. No  evidence of tricuspid stenosis. Aortic Valve: The aortic valve is tricuspid. Aortic valve regurgitation is not visualized. No aortic stenosis is present. Pulmonic Valve: The pulmonic valve was normal in structure. Pulmonic valve regurgitation is not visualized. No evidence of pulmonic stenosis. Aorta: The aortic root is normal in size and structure. Venous: The inferior vena cava is dilated in size with less than 50% respiratory variability, suggesting right atrial pressure of 15 mmHg. IAS/Shunts: No atrial level shunt detected by color flow Doppler.  LEFT VENTRICLE PLAX 2D LVIDd:         5.20 cm         Diastology LVIDs:         3.60 cm         LV e' medial:    4.43 cm/s LV  PW:         1.10 cm         LV E/e' medial:  12.3 LV IVS:        1.20 cm         LV e' lateral:   6.90 cm/s LVOT diam:     1.90 cm         LV E/e' lateral: 7.9 LV SV:         56 LV SV Index:   24 LVOT Area:     2.84 cm        3D Volume EF                                LV 3D EF:    Left                                             ventricul LV Volumes (MOD)                            ar LV vol d, MOD    112.0 ml                   ejection A2C:                                        fraction LV vol d, MOD    90.6 ml                    by 3D A4C:                                        volume is LV vol s, MOD    51.2 ml                    45 %. A2C: LV vol s, MOD    36.1 ml A4C:                           3D Volume EF: LV SV MOD A2C:   60.8 ml       3D EF:        45 % LV SV MOD A4C:   90.6 ml       LV EDV:       149 ml LV SV MOD BP:    56.5 ml       LV ESV:       83 ml                                LV SV:        66 ml RIGHT VENTRICLE             IVC RV S prime:     12.80 cm/s  IVC diam: 2.70 cm TAPSE (M-mode): 2.0 cm LEFT ATRIUM              Index  RIGHT ATRIUM           Index LA diam:        4.50 cm  1.95 cm/m   RA Area:     17.20 cm LA Vol (A2C):   106.0 ml 45.94 ml/m  RA Volume:   43.60 ml  18.90 ml/m LA Vol (A4C):   58.9 ml  25.53  ml/m LA Biplane Vol: 84.0 ml  36.41 ml/m  AORTIC VALVE LVOT Vmax:   119.00 cm/s LVOT Vmean:  73.700 cm/s LVOT VTI:    0.196 m  AORTA Ao Root diam: 3.30 cm Ao Asc diam:  3.00 cm MITRAL VALVE MV Area (PHT): 3.31 cm    SHUNTS MV Decel Time: 229 msec    Systemic VTI:  0.20 m MV E velocity: 54.50 cm/s  Systemic Diam: 1.90 cm MV A velocity: 36.00 cm/s MV E/A ratio:  1.51 Donato Schultz MD Electronically signed by Donato Schultz MD Signature Date/Time: 08/01/2022/2:51:08 PM    Final    CT HEAD WO CONTRAST ( )  Result Date: 07/31/2022 CLINICAL DATA:  Headache, sudden, severe EXAM: CT HEAD WITHOUT CONTRAST TECHNIQUE: Contiguous axial images were obtained from the base of the skull through the vertex without intravenous contrast. RADIATION DOSE REDUCTION: This exam was performed according to the departmental dose-optimization program which includes automated exposure control, adjustment of the mA and/or kV according to patient size and/or use of iterative reconstruction technique. COMPARISON:  None Available. FINDINGS: Brain: Normal anatomic configuration. No abnormal intra or extra-axial mass lesion or fluid collection. No abnormal mass effect or midline shift. No evidence of acute intracranial hemorrhage or infarct. Ventricular size is normal. Cerebellum unremarkable. Vascular: Unremarkable Skull: Intact Sinuses/Orbits: Paranasal sinuses are clear. Orbits are unremarkable. Other: Mastoid air cells and middle ear cavities are clear. IMPRESSION: No acute intracranial abnormality. Electronically Signed   By: Helyn Numbers M.D.   On: 07/31/2022 23:16   DG Chest 2 View  Result Date: 07/31/2022 CLINICAL DATA:  Chest pain.  Headache. EXAM: CHEST - 2 VIEW COMPARISON:  None Available. FINDINGS: Cardiac silhouette is normal in size. No mediastinal or hilar masses. No evidence of adenopathy. Clear lungs.  No pleural effusion or pneumothorax. Skeletal structures are unremarkable. IMPRESSION: No active cardiopulmonary disease.  Electronically Signed   By: Amie Portland M.D.   On: 07/31/2022 13:57   DG Foot Complete Right  Addendum Date: 07/22/2022   ADDENDUM REPORT: 07/22/2022 21:07 ADDENDUM: Images were reviewed again. There is 1.5 cm smoothly marginated calcification adjacent to the posteromedial aspect of right navicular. 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 There is no significant soft tissue swelling over the medial aspect of the hindfoot. Accessory navicular could cause clinical symptoms. Please correlate with clinical physical examination findings. If there are focal symptoms in this region, follow-up MRI may be considered. Electronically Signed   By: Ernie Avena M.D.   On: 07/22/2022 21:07   Result Date: 07/22/2022 CLINICAL DATA:  Pain x1 week EXAM: RIGHT FOOT COMPLETE - 3+ VIEW COMPARISON:  None Available. FINDINGS: There is no evidence of fracture or dislocation. There is no evidence of arthropathy or other focal bone abnormality. Soft tissues are unremarkable. IMPRESSION: No fracture or dislocation is seen in right foot. Electronically Signed: By: Ernie Avena M.D. On: 07/17/2022 12:14   DG Foot Complete Right  Result Date: 07/22/2022 CLINICAL DATA:  Right foot pain.  No known injury. EXAM: RIGHT FOOT COMPLETE - 3+ VIEW COMPARISON:  Right foot radiograph dated 07/17/2022. FINDINGS: There is no  acute fracture or dislocation. The bones are well mineralized. There is a moderate sized os navicularis. There is minimal soft tissue edema over the os navicularis. No radiopaque foreign object or soft tissue gas. IMPRESSION: 1. No acute osseous pathology. 2. 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 Electronically Signed   By: Elgie Collard M.D.   On: 07/22/2022 19:23   DG Finger Thumb Right  Result Date: 07/17/2022 CLINICAL DATA:  Trauma, fall 2 weeks earlier, pain EXAM: RIGHT THUMB 2+V COMPARISON:  None Available. FINDINGS: No recent fracture or dislocation is seen. Small smoothly  marginated calcifications in the palmar aspect of first metacarpophalangeal joint and the intercarpal joint of the right thumb most likely suggest sesamoid bones. There are no opaque foreign bodies. IMPRESSION: No fracture or dislocation is seen in right thumb. Electronically Signed   By: Ernie Avena M.D.   On: 07/17/2022 12:12    DISCHARGE EXAMINATION: See progress note from earlier today.  DISPOSITION: Home  Discharge Instructions     (HEART FAILURE PATIENTS) Call MD:  Anytime you have any of the following symptoms: 1) 3 pound weight gain in 24 hours or 5 pounds in 1 week 2) shortness of breath, with or without a dry hacking cough 3) swelling in the hands, feet or stomach 4) if you have to sleep on extra pillows at night in order to breathe.   Complete by: As directed    Call MD for:  difficulty breathing, headache or visual disturbances   Complete by: As directed    Call MD for:  extreme fatigue   Complete by: As directed    Call MD for:  persistant dizziness or light-headedness   Complete by: As directed    Call MD for:  persistant nausea and vomiting   Complete by: As directed    Call MD for:  severe uncontrolled pain   Complete by: As directed    Call MD for:  temperature >100.4   Complete by: As directed    Diet - low sodium heart healthy   Complete by: As directed    Discharge instructions   Complete by: As directed    Please take your medications as prescribed.  Cardiologist will arrange outpatient follow-up for you.  You were cared for by a hospitalist during your hospital stay. If you have any questions about your discharge medications or the care you received while you were in the hospital after you are discharged, you can call the unit and asked to speak with the hospitalist on call if the hospitalist that took care of you is not available. Once you are discharged, your primary care physician will handle any further medical issues. Please note that NO REFILLS for any  discharge medications will be authorized once you are discharged, as it is imperative that you return to your primary care physician (or establish a relationship with a primary care physician if you do not have one) for your aftercare needs so that they can reassess your need for medications and monitor your lab values. If you do not have a primary care physician, you can call [phone] for a physician referral.   Increase activity slowly   Complete by: As directed         Allergies as of 08/02/2022       Reactions   Contrast Media [iodinated Contrast Media] Nausea And Vomiting   Penicillins Hives, Itching, Other (See Comments)   Did it involve swelling of the face/tongue/throat, SOB, or low BP?  No Did it involve sudden or severe rash/hives, skin peeling, or any reaction on the inside of your mouth or nose? Yes Did you need to seek medical attention at a hospital or doctor's office? Yes When did it last happen?      Childhood allergy If all above answers are "NO", may proceed with cephalosporin use.   Toradol [ketorolac Tromethamine] Nausea Only, Other (See Comments)   migraines   Tramadol Other (See Comments)   migraines   Nsaids    Watermelon [citrullus Vulgaris] Other (See Comments)   Throat itches        Medication List     STOP taking these medications    cyclobenzaprine 10 MG tablet Commonly known as: FLEXERIL   naproxen 375 MG tablet Commonly known as: NAPROSYN   omeprazole 20 MG capsule Commonly known as: PRILOSEC Replaced by: pantoprazole 40 MG tablet       TAKE these medications    diclofenac Sodium 1 % Gel Commonly known as: Voltaren Apply 2 g topically 4 (four) times daily. What changed:  when to take this reasons to take this   HYDROcodone-acetaminophen 5-325 MG tablet Commonly known as: NORCO/VICODIN Take 1 tablet by mouth every 6 (six) hours as needed for severe pain.   lamoTRIgine 25 MG tablet Commonly known as: LAMICTAL Take 25 mg by mouth  daily.   losartan 25 MG tablet Commonly known as: COZAAR Take 0.5 tablets (12.5 mg total) by mouth daily. Start taking on: August 03, 2022   metoprolol succinate 25 MG 24 hr tablet Commonly known as: TOPROL-XL Take 0.5 tablets (12.5 mg total) by mouth daily. Start taking on: August 03, 2022   ondansetron 8 MG disintegrating tablet Commonly known as: ZOFRAN-ODT Take 8 mg by mouth every 8 (eight) hours as needed for nausea or vomiting.   pantoprazole 40 MG tablet Commonly known as: PROTONIX Take 1 tablet (40 mg total) by mouth daily for 14 days. Replaces: omeprazole 20 MG capsule   promethazine 25 MG tablet Commonly known as: PHENERGAN Take 25 mg by mouth every 6 (six) hours as needed for vomiting or nausea.   QUEtiapine 25 MG tablet Commonly known as: SEROQUEL Take 25 mg by mouth at bedtime.   testosterone enanthate 200 MG/ML injection Commonly known as: DELATESTRYL Inject 200 mg into the muscle every 7 (seven) days. For IM use only   thiamine 100 MG tablet Commonly known as: VITAMIN B1 Take 1 tablet (100 mg total) by mouth daily.   venlafaxine XR 37.5 MG 24 hr capsule Commonly known as: EFFEXOR-XR Take 37.5 mg by mouth every morning.          Follow-up Information     Jodelle Red, MD Follow up.   Specialty: Cardiology Why: [facility] [place] location - a cardiology follow-up has been arranged for you with Dr. Cristal Deer on Thursday Sep 01, 2022 at 11:40 AM (Arrive by 11:25 AM). Contact information: [address] [place] [address] [phone]                 TOTAL DISCHARGE TIME: 35 mins  Iaan Oregel Rito Ehrlich  [facility] Pager on [email or web address]  08/02/2022, 1:23 PM

## 2022-08-02 NOTE — Progress Notes (Addendum)
Patient seen and examined, note reviewed with the signed Advanced Practice Provider. I personally reviewed laboratory data, imaging studies and relevant notes. I independently examined the patient and formulated the important aspects of the plan. I have personally discussed the plan with the patient and/or family. Comments or changes to the note/plan are indicated below.  Pending CCTA today, which I will read. Continues to feel poorly with intermittent sharp chest pain. Thirsty.  Exam unchanged.  Will use CCTA for recommendations on aspirin/statin. If no significant CAD, will start low dose ARB/beta blocker for cardiomyopathy. Consider SLGT2i vs GLP1RA on outpatient follow up.  Jodelle Red, MD, PhD, [facility] [place]  [facility]  [place]  [facility] at [facility] [address] [place], [place] [address] [phone]        Progress Note  Patient Name: KATERYN MARASIGAN Date of Encounter: 08/02/2022  [facility] Cardiologist: Jodelle Red, MD   Subjective   Intermittent sharp chest pain. No dyspnea. For CCTA today.   Inpatient Medications    Scheduled Meds:  aspirin EC  81 mg Oral Daily   folic acid  1 mg Oral Daily   metoprolol tartrate  100 mg Oral Once   multivitamin with minerals  1 tablet Oral Daily   ondansetron (ZOFRAN) IV  4 mg Intravenous Once   pantoprazole  40 mg Oral Daily   QUEtiapine  25 mg Oral QHS   thiamine  100 mg Oral Daily   Or   thiamine  100 mg Intravenous Daily   venlafaxine XR  37.5 mg Oral q morning   Continuous Infusions:  PRN Meds: acetaminophen **OR** acetaminophen, HYDROcodone-acetaminophen, hydrOXYzine, LORazepam **OR** LORazepam, morphine injection, nitroGLYCERIN   Vital Signs    Vitals:   08/01/22 0938 08/01/22 1254 08/01/22 2230 08/02/22 0520  BP: (!) 130/99 (!) 140/95 (!) 138/99 (!) 133/99  Pulse: 72 77 64 (!) 53  Resp: 16 20 18 18   Temp: 98.2  F (36.8 C) 98.2 F (36.8 C) 98.4 F (36.9 C) 97.9 F (36.6 C)  TempSrc: Oral Oral Oral Oral  SpO2: 97% 98% 94% 97%  Weight:      Height:        Intake/Output Summary (Last 24 hours) at 08/02/2022 0826 Last data filed at 08/01/2022 1700 Gross per 24 hour  Intake 120 ml  Output --  Net 120 ml      07/31/2022    1:27 PM 07/24/2022    5:37 PM 07/22/2022    6:40 PM  Last 3 Weights  Weight (lbs) 280 lb 280 lb 280 lb  Weight (kg) 127.007 kg 127.007 kg 127.007 kg      Telemetry    Sinus bradycardia/rhythm HR 50-60s - Personally Reviewed  ECG    N/A  Physical Exam   GEN: No acute distress.   Neck: No JVD Cardiac: RRR, no murmurs, rubs, or gallops.  Respiratory: Clear to auscultation bilaterally. GI: Soft, nontender, non-distended  MS: No edema; No deformity. Neuro:  Nonfocal  Psych: Normal affect   Labs    High Sensitivity Troponin:   Recent Labs  Lab 07/31/22 1332 07/31/22 1532 07/31/22 2147  TROPONINIHS 32* 29* 31*     Chemistry Recent Labs  Lab 07/31/22 1332 07/31/22 2147 08/01/22 0415  NA 139  --  140  K 4.5  --  4.3  CL 110  --  114*  CO2 22  --  23  GLUCOSE 91  --  101*  BUN 12  --  14  CREATININE 1.08*  --  1.00  CALCIUM 9.0  --  8.0*  MG  --  1.9  --   PROT  --   --  5.9*  ALBUMIN  --   --  3.1*  AST  --   --  30  ALT  --   --  24  ALKPHOS  --   --  49  BILITOT  --   --  0.7  GFRNONAA >60  --  >60  ANIONGAP 7  --  3*    Lipids  Recent Labs  Lab 08/01/22 1458  CHOL 153  TRIG 69  HDL 35*  LDLCALC 104*  CHOLHDL 4.4    Hematology Recent Labs  Lab 07/31/22 1332 08/01/22 0415  WBC 5.8 5.1  RBC 5.13* 4.33  HGB 15.7* 13.2  HCT 48.7* 41.8  MCV 94.9 96.5  MCH 30.6 30.5  MCHC 32.2 31.6  RDW 15.5 15.5  PLT 240 201   Thyroid  Recent Labs  Lab 07/31/22 2147  TSH 2.306    BNPNo results for input(s): "BNP", "PROBNP" in the last 168 hours.  DDimer  Recent Labs  Lab 07/31/22 1332  DDIMER 0.35     Radiology     ECHOCARDIOGRAM COMPLETE  Result Date: 08/01/2022    ECHOCARDIOGRAM REPORT   Patient Name:   CADEDRA ABDULLAH Date of Exam: 08/01/2022 Medical Rec #:  [id number]           Height:       66.0 in Accession #:    [id number]          Weight:       280.0 lb Date of Birth:  Dec 15, 1986           BSA:          2.307 m Patient Age:    36 years            BP:           130/99 mmHg Patient Gender: F                   HR:           67 bpm. Exam Location:  Inpatient Procedure: 2D Echo, 3D Echo, Cardiac Doppler, Color Doppler and Intracardiac            Opacification Agent Indications:    R07.9* Chest pain, unspecified  History:        Patient has no prior history of Echocardiogram examinations.                 Signs/Symptoms:Chest Pain. ETOH.  Sonographer:    Roseanna Rainbow RDCS Referring Phys: [place]  Sonographer Comments: Technically difficult study due to poor echo windows and patient is morbidly obese. Image acquisition challenging due to patient body habitus. Delay sending study IMPRESSIONS  1. Left ventricular ejection fraction by 3D volume is 45 %. The left ventricle has mildly decreased function. The left ventricle demonstrates global hypokinesis. There is mild left ventricular hypertrophy. Left ventricular diastolic parameters are consistent with Grade II diastolic dysfunction (pseudonormalization).  2. Right ventricular systolic function is normal. The right ventricular size is normal.  3. Left atrial size was mildly dilated.  4. The mitral valve is normal in structure. Trivial mitral valve regurgitation. No evidence of mitral stenosis.  5. The aortic valve is tricuspid. Aortic valve regurgitation is not visualized. No aortic stenosis is present.  6. The  inferior vena cava is dilated in size with <50% respiratory variability, suggesting right atrial pressure of 15 mmHg. FINDINGS  Left Ventricle: Left ventricular ejection fraction by 3D volume is 45 %. The left ventricle has mildly decreased function. The  left ventricle demonstrates global hypokinesis. Definity contrast agent was given IV to delineate the left ventricular endocardial borders. The left ventricular internal cavity size was normal in size. There is mild left ventricular hypertrophy. Left ventricular diastolic parameters are consistent with Grade II diastolic dysfunction (pseudonormalization). Right Ventricle: The right ventricular size is normal. No increase in right ventricular wall thickness. Right ventricular systolic function is normal. Left Atrium: Left atrial size was mildly dilated. Right Atrium: Right atrial size was normal in size. Pericardium: There is no evidence of pericardial effusion. Mitral Valve: The mitral valve is normal in structure. Trivial mitral valve regurgitation. No evidence of mitral valve stenosis. Tricuspid Valve: The tricuspid valve is normal in structure. Tricuspid valve regurgitation is not demonstrated. No evidence of tricuspid stenosis. Aortic Valve: The aortic valve is tricuspid. Aortic valve regurgitation is not visualized. No aortic stenosis is present. Pulmonic Valve: The pulmonic valve was normal in structure. Pulmonic valve regurgitation is not visualized. No evidence of pulmonic stenosis. Aorta: The aortic root is normal in size and structure. Venous: The inferior vena cava is dilated in size with less than 50% respiratory variability, suggesting right atrial pressure of 15 mmHg. IAS/Shunts: No atrial level shunt detected by color flow Doppler.  LEFT VENTRICLE PLAX 2D LVIDd:         5.20 cm         Diastology LVIDs:         3.60 cm         LV e' medial:    4.43 cm/s LV PW:         1.10 cm         LV E/e' medial:  12.3 LV IVS:        1.20 cm         LV e' lateral:   6.90 cm/s LVOT diam:     1.90 cm         LV E/e' lateral: 7.9 LV SV:         56 LV SV Index:   24 LVOT Area:     2.84 cm        3D Volume EF                                LV 3D EF:    Left                                             ventricul LV  Volumes (MOD)                            ar LV vol d, MOD    112.0 ml                   ejection A2C:                                        fraction LV vol d, MOD  90.6 ml                    by 3D A4C:                                        volume is LV vol s, MOD    51.2 ml                    45 %. A2C: LV vol s, MOD    36.1 ml A4C:                           3D Volume EF: LV SV MOD A2C:   60.8 ml       3D EF:        45 % LV SV MOD A4C:   90.6 ml       LV EDV:       149 ml LV SV MOD BP:    56.5 ml       LV ESV:       83 ml                                LV SV:        66 ml RIGHT VENTRICLE             IVC RV S prime:     12.80 cm/s  IVC diam: 2.70 cm TAPSE (M-mode): 2.0 cm LEFT ATRIUM              Index        RIGHT ATRIUM           Index LA diam:        4.50 cm  1.95 cm/m   RA Area:     17.20 cm LA Vol (A2C):   106.0 ml 45.94 ml/m  RA Volume:   43.60 ml  18.90 ml/m LA Vol (A4C):   58.9 ml  25.53 ml/m LA Biplane Vol: 84.0 ml  36.41 ml/m  AORTIC VALVE LVOT Vmax:   119.00 cm/s LVOT Vmean:  73.700 cm/s LVOT VTI:    0.196 m  AORTA Ao Root diam: 3.30 cm Ao Asc diam:  3.00 cm MITRAL VALVE MV Area (PHT): 3.31 cm    SHUNTS MV Decel Time: 229 msec    Systemic VTI:  0.20 m MV E velocity: 54.50 cm/s  Systemic Diam: 1.90 cm MV A velocity: 36.00 cm/s MV E/A ratio:  1.51 Donato Schultz MD Electronically signed by Donato Schultz MD Signature Date/Time: 08/01/2022/2:51:08 PM    Final    CT HEAD WO CONTRAST ( )  Result Date: 07/31/2022 CLINICAL DATA:  Headache, sudden, severe EXAM: CT HEAD WITHOUT CONTRAST TECHNIQUE: Contiguous axial images were obtained from the base of the skull through the vertex without intravenous contrast. RADIATION DOSE REDUCTION: This exam was performed according to the departmental dose-optimization program which includes automated exposure control, adjustment of the mA and/or kV according to patient size and/or use of iterative reconstruction technique. COMPARISON:  None Available. FINDINGS: Brain:  Normal anatomic configuration. No abnormal intra or extra-axial mass lesion or fluid collection. No abnormal mass effect or midline shift. No evidence of acute intracranial hemorrhage or infarct. Ventricular size is normal. Cerebellum unremarkable. Vascular: Unremarkable Skull: Intact Sinuses/Orbits: Paranasal sinuses are  clear. Orbits are unremarkable. Other: Mastoid air cells and middle ear cavities are clear. IMPRESSION: No acute intracranial abnormality. Electronically Signed   By: Fidela Salisbury M.D.   On: 07/31/2022 23:16   DG Chest 2 View  Result Date: 07/31/2022 CLINICAL DATA:  Chest pain.  Headache. EXAM: CHEST - 2 VIEW COMPARISON:  None Available. FINDINGS: Cardiac silhouette is normal in size. No mediastinal or hilar masses. No evidence of adenopathy. Clear lungs.  No pleural effusion or pneumothorax. Skeletal structures are unremarkable. IMPRESSION: No active cardiopulmonary disease. Electronically Signed   By: Lajean Manes M.D.   On: 07/31/2022 13:57    Cardiac Studies   Echo 08/01/22 1. Left ventricular ejection fraction by 3D volume is 45 %. The left  ventricle has mildly decreased function. The left ventricle demonstrates  global hypokinesis. There is mild left ventricular hypertrophy. Left  ventricular diastolic parameters are  consistent with Grade II diastolic dysfunction (pseudonormalization).   2. Right ventricular systolic function is normal. The right ventricular  size is normal.   3. Left atrial size was mildly dilated.   4. The mitral valve is normal in structure. Trivial mitral valve  regurgitation. No evidence of mitral stenosis.   5. The aortic valve is tricuspid. Aortic valve regurgitation is not  visualized. No aortic stenosis is present.   6. The inferior vena cava is dilated in size with <50% respiratory  variability, suggesting right atrial pressure of 15 mmHg.   Patient Profile     36 y.o. adult with PMH chronic diastolic heart failure, morbid obesity (BMI  45), on testosterone (transitioning) presented with acute onset of pleuritic/sharp chest pain, worse with inspiration, radiating on right side.  Assessment & Plan    Chest pain -Sounds atypical.  Troponin flat.  EKG without acute ischemic changes.  Echocardiogram showed LV function of [id number], grade 2 diastolic dysfunction and global hypokinesis. -Plan for coronary CT with pre-medication with antinausea (Has had nausea with CT contrast but no anaphylaxis)  2.  Cardiomyopathy -Echo with LV function of 45%. -Plan for coronary CT today -Guideline directed medical therapy based on findings  3.  Morbid obesity -Encouraged to weight loss  For questions or updates, please contact [place] Please consult [email or web address] for contact info under        SignedLeanor Kail, PA  08/02/2022, 8:26 AM

## 2022-08-02 NOTE — Progress Notes (Signed)
[facility] PROGRESS NOTE   Michelle Strickland [id number]:[id number] DOB: 01/04/1986 DOA: 07/31/2022  PCP: Pcp, No  Brief History/Interval Summary: 36 year old adult with no significant past medical history except for obesity came in with chest pain and headache onset was while patient was having an argument with significant other.  Consultants: Cardiology  Procedures: Transthoracic echocardiogram    Subjective/Interval History: Did have some chest discomfort overnight.  Some nausea.  Denies any difficulty breathing currently.  No abdominal pain.     Assessment/Plan:  Chest pain/mildly elevated troponin/chronic systolic CHF Troponin levels abnormal but flat trend.  EKG nonischemic.   Echocardiogram showed EF of 45% with global hypokinesis.  Patient seen by cardiology.  D-dimer was noted to be normal. Plan is for coronary CTA today.   Noted to be on aspirin.  LDL noted to be 104.  Will likely need to be started on statin prior to discharge.  Elevated blood pressure without diagnosis of hypertension Blood pressures have improved compared to the time of admission but still somewhat elevated.  Wait for cardiac evaluation to be completed before deciding on antihypertensives.    Headache Likely triggered by stress.  CT was unremarkable.  Headache seems to have improved.  Schizoaffective disorder/bipolar  Continue home medications.  Alcohol abuse Started on CIWA protocol.  No evidence for withdrawal symptoms currently.  Hypophosphatemia Repleted.   Class III obesity Estimated body mass index is 45.19 kg/m as calculated from the following:   Height as of this encounter: 5\' 6"  (1.676 m).   Weight as of this encounter: 127 kg.   DVT Prophylaxis: SCDs Code Status: Full code Family Communication: Discussed with patient Disposition Plan: Coronary CTA ordered by cardiology.  Waiting on cardiac clearance prior to discharge.      Medications: Scheduled:  aspirin EC  81 mg  Oral Daily   folic acid  1 mg Oral Daily   metoprolol tartrate  100 mg Oral Once   multivitamin with minerals  1 tablet Oral Daily   ondansetron (ZOFRAN) IV  4 mg Intravenous Once   pantoprazole  40 mg Oral Daily   QUEtiapine  25 mg Oral QHS   thiamine  100 mg Oral Daily   Or   thiamine  100 mg Intravenous Daily   venlafaxine XR  37.5 mg Oral q morning   Continuous: **OR** acetaminophen, HYDROcodone-acetaminophen, hydrOXYzine, LORazepam **OR** LORazepam, morphine injection, nitroGLYCERIN  Antibiotics: Anti-infectives (From admission, onward)    None       Objective:  Vital Signs  Vitals:   08/01/22 0938 08/01/22 1254 08/01/22 2230 08/02/22 0520  BP: (!) 130/99 (!) 140/95 (!) 138/99 (!) 133/99  Pulse: 72 77 64 (!) 53  Resp: 16 20 18 18   Temp: 98.2 F (36.8 C) 98.2 F (36.8 C) 98.4 F (36.9 C) 97.9 F (36.6 C)  TempSrc: Oral Oral Oral Oral  SpO2: 97% 98% 94% 97%  Weight:      Height:        Intake/Output Summary (Last 24 hours) at 08/02/2022 1005 Last data filed at 08/01/2022 1700 Gross per 24 hour  Intake 120 ml  Output --  Net 120 ml    Filed Weights   07/31/22 1327  Weight: 127 kg    General appearance: Awake alert.  In no distress Resp: Clear to auscultation bilaterally.  Normal effort Cardio: S1-S2 is normal regular.  No S3-S4.  No rubs murmurs or bruit GI: Abdomen is soft.  Nontender nondistended.  Bowel sounds are present normal.  No masses organomegaly Extremities: No edema.  Full range of motion of lower extremities. Neurologic: Alert and oriented x3.  No focal neurological deficits.    Lab Results:  Data Reviewed: I have personally reviewed following labs and reports of the imaging studies  CBC: Recent Labs  Lab 07/31/22 1332 08/01/22 0415  WBC 5.8 5.1  HGB 15.7* 13.2  HCT 48.7* 41.8  MCV 94.9 96.5  PLT 240 201     Basic Metabolic Panel: Recent Labs  Lab 07/31/22 1332 07/31/22 2147 08/01/22 0415  NA 139  --   140  K 4.5  --  4.3  CL 110  --  114*  CO2 22  --  23  GLUCOSE 91  --  101*  BUN 12  --  14  CREATININE 1.08*  --  1.00  CALCIUM 9.0  --  8.0*  MG  --  1.9  --   PHOS  --  2.2* 3.8     GFR: Estimated Creatinine Clearance (by C-G formula based on SCr of 1 mg/dL) Female: 676.1 mL/min Female: 129.9 mL/min  Liver Function Tests: Recent Labs  Lab 08/01/22 0415  AST 30  ALT 24  ALKPHOS 49  BILITOT 0.7  PROT 5.9*  ALBUMIN 3.1*      Cardiac Enzymes: Recent Labs  Lab 07/31/22 2147  CKTOTAL 159      Thyroid Function Tests: Recent Labs    07/31/22 2147  TSH 2.306      Radiology Studies: ECHOCARDIOGRAM COMPLETE  Result Date: 08/01/2022    ECHOCARDIOGRAM REPORT   Patient Name:   Michelle Strickland Date of Exam: 08/01/2022 Medical Rec #:  [id number]           Height:       66.0 in Accession #:    [id number]          Weight:       280.0 lb Date of Birth:  06/06/1986           BSA:          2.307 m Patient Age:    36 years            BP:           130/99 mmHg Patient Gender: F                   HR:           67 bpm. Exam Location:  Inpatient Procedure: 2D Echo, 3D Echo, Cardiac Doppler, Color Doppler and Intracardiac            Opacification Agent Indications:    R07.9* Chest pain, unspecified  History:        Patient has no prior history of Echocardiogram examinations.                 Signs/Symptoms:Chest Pain. ETOH.  Sonographer:    Sheralyn Boatman RDCS Referring Phys: [id number] ANASTASSIA DOUTOVA  Sonographer Comments: Technically difficult study due to poor echo windows and patient is morbidly obese. Image acquisition challenging due to patient body habitus. Delay sending study IMPRESSIONS  1. Left ventricular ejection fraction by 3D volume is 45 %. The left ventricle has mildly decreased function. The left ventricle demonstrates global hypokinesis. There is mild left ventricular hypertrophy. Left ventricular diastolic parameters are consistent with Grade II diastolic dysfunction  (pseudonormalization).  2. Right ventricular systolic function is normal. The right ventricular size is normal.  3. Left atrial size was mildly dilated.  4. The mitral valve is normal in structure. Trivial mitral valve regurgitation. No evidence of mitral stenosis.  5. The aortic valve is tricuspid. Aortic valve regurgitation is not visualized. No aortic stenosis is present.  6. The inferior vena cava is dilated in size with <50% respiratory variability, suggesting right atrial pressure of 15 mmHg. FINDINGS  Left Ventricle: Left ventricular ejection fraction by 3D volume is 45 %. The left ventricle has mildly decreased function. The left ventricle demonstrates global hypokinesis. Definity contrast agent was given IV to delineate the left ventricular endocardial borders. The left ventricular internal cavity size was normal in size. There is mild left ventricular hypertrophy. Left ventricular diastolic parameters are consistent with Grade II diastolic dysfunction (pseudonormalization). Right Ventricle: The right ventricular size is normal. No increase in right ventricular wall thickness. Right ventricular systolic function is normal. Left Atrium: Left atrial size was mildly dilated. Right Atrium: Right atrial size was normal in size. Pericardium: There is no evidence of pericardial effusion. Mitral Valve: The mitral valve is normal in structure. Trivial mitral valve regurgitation. No evidence of mitral valve stenosis. Tricuspid Valve: The tricuspid valve is normal in structure. Tricuspid valve regurgitation is not demonstrated. No evidence of tricuspid stenosis. Aortic Valve: The aortic valve is tricuspid. Aortic valve regurgitation is not visualized. No aortic stenosis is present. Pulmonic Valve: The pulmonic valve was normal in structure. Pulmonic valve regurgitation is not visualized. No evidence of pulmonic stenosis. Aorta: The aortic root is normal in size and structure. Venous: The inferior vena cava is dilated  in size with less than 50% respiratory variability, suggesting right atrial pressure of 15 mmHg. IAS/Shunts: No atrial level shunt detected by color flow Doppler.  LEFT VENTRICLE PLAX 2D LVIDd:         5.20 cm         Diastology LVIDs:         3.60 cm         LV e' medial:    4.43 cm/s LV PW:         1.10 cm         LV E/e' medial:  12.3 LV IVS:        1.20 cm         LV e' lateral:   6.90 cm/s LVOT diam:     1.90 cm         LV E/e' lateral: 7.9 LV SV:         56 LV SV Index:   24 LVOT Area:     2.84 cm        3D Volume EF                                LV 3D EF:    Left                                             ventricul LV Volumes (MOD)                            ar LV vol d, MOD    112.0 ml                   ejection A2C:  fraction LV vol d, MOD    90.6 ml                    by 3D A4C:                                        volume is LV vol s, MOD    51.2 ml                    45 %. A2C: LV vol s, MOD    36.1 ml A4C:                           3D Volume EF: LV SV MOD A2C:   60.8 ml       3D EF:        45 % LV SV MOD A4C:   90.6 ml       LV EDV:       149 ml LV SV MOD BP:    56.5 ml       LV ESV:       83 ml                                LV SV:        66 ml RIGHT VENTRICLE             IVC RV S prime:     12.80 cm/s  IVC diam: 2.70 cm TAPSE (M-mode): 2.0 cm LEFT ATRIUM              Index        RIGHT ATRIUM           Index LA diam:        4.50 cm  1.95 cm/m   RA Area:     17.20 cm LA Vol (A2C):   106.0 ml 45.94 ml/m  RA Volume:   43.60 ml  18.90 ml/m LA Vol (A4C):   58.9 ml  25.53 ml/m LA Biplane Vol: 84.0 ml  36.41 ml/m  AORTIC VALVE LVOT Vmax:   119.00 cm/s LVOT Vmean:  73.700 cm/s LVOT VTI:    0.196 m  AORTA Ao Root diam: 3.30 cm Ao Asc diam:  3.00 cm MITRAL VALVE MV Area (PHT): 3.31 cm    SHUNTS MV Decel Time: 229 msec    Systemic VTI:  0.20 m MV E velocity: 54.50 cm/s  Systemic Diam: 1.90 cm MV A velocity: 36.00 cm/s MV E/A ratio:  1.51 Donato Schultz MD  Electronically signed by Donato Schultz MD Signature Date/Time: 08/01/2022/2:51:08 PM    Final    CT HEAD WO CONTRAST ( )  Result Date: 07/31/2022 CLINICAL DATA:  Headache, sudden, severe EXAM: CT HEAD WITHOUT CONTRAST TECHNIQUE: Contiguous axial images were obtained from the base of the skull through the vertex without intravenous contrast. RADIATION DOSE REDUCTION: This exam was performed according to the departmental dose-optimization program which includes automated exposure control, adjustment of the mA and/or kV according to patient size and/or use of iterative reconstruction technique. COMPARISON:  None Available. FINDINGS: Brain: Normal anatomic configuration. No abnormal intra or extra-axial mass lesion or fluid collection. No abnormal mass effect or midline shift. No evidence of acute intracranial hemorrhage or infarct. Ventricular size is normal. Cerebellum unremarkable.  Vascular: Unremarkable Skull: Intact Sinuses/Orbits: Paranasal sinuses are clear. Orbits are unremarkable. Other: Mastoid air cells and middle ear cavities are clear. IMPRESSION: No acute intracranial abnormality. Electronically Signed   By: Helyn Numbers M.D.   On: 07/31/2022 23:16   DG Chest 2 View  Result Date: 07/31/2022 CLINICAL DATA:  Chest pain.  Headache. EXAM: CHEST - 2 VIEW COMPARISON:  None Available. FINDINGS: Cardiac silhouette is normal in size. No mediastinal or hilar masses. No evidence of adenopathy. Clear lungs.  No pleural effusion or pneumothorax. Skeletal structures are unremarkable. IMPRESSION: No active cardiopulmonary disease. Electronically Signed   By: Amie Portland M.D.   On: 07/31/2022 13:57       LOS: 0 days   Khalee Mazo Rito Ehrlich  [facility] Pager on [email or web address]  08/02/2022, 10:05 AM

## 2022-08-02 NOTE — Progress Notes (Signed)
Brief cardiology follow up note  I personally read CT--no coronary disease. Mildly dilated PA, can be monitored. I called and discussed with the patient, all questions answered. I will start very low dose metoprolol succinate and losartan for the nonischemic cardiomyopathy. Instructed that if he notes any lightheadedness, he should stop the medications and call us.  I will arrange follow up with me in a few weeks. Ok for discharge from cardiac perspective.  Jodelle Red, MD, PhD, [facility]  [facility]  [place]  [facility] & V[facility] at [facility] [address], [address] [place], [place] [address] [phone]

## 2022-08-06 ENCOUNTER — Encounter (HOSPITAL_COMMUNITY): Payer: Self-pay

## 2022-08-06 ENCOUNTER — Emergency Department (HOSPITAL_COMMUNITY): Payer: 59

## 2022-08-06 ENCOUNTER — Other Ambulatory Visit: Payer: Self-pay

## 2022-08-06 ENCOUNTER — Observation Stay (HOSPITAL_COMMUNITY)
Admission: EM | Admit: 2022-08-06 | Discharge: 2022-08-07 | Disposition: A | Discharge status: Home / Self Care | Payer: 59 | Attending: Internal Medicine | Admitting: Internal Medicine

## 2022-08-06 DIAGNOSIS — Z79899 Other long term (current) drug therapy: Secondary | ICD-10-CM | POA: Diagnosis not present

## 2022-08-06 DIAGNOSIS — R0609 Other forms of dyspnea: Secondary | ICD-10-CM | POA: Diagnosis not present

## 2022-08-06 DIAGNOSIS — K760 Fatty (change of) liver, not elsewhere classified: Secondary | ICD-10-CM | POA: Diagnosis present

## 2022-08-06 DIAGNOSIS — I248 Other forms of acute ischemic heart disease: Secondary | ICD-10-CM | POA: Diagnosis not present

## 2022-08-06 DIAGNOSIS — J45909 Unspecified asthma, uncomplicated: Secondary | ICD-10-CM | POA: Insufficient documentation

## 2022-08-06 DIAGNOSIS — R0602 Shortness of breath: Secondary | ICD-10-CM | POA: Diagnosis present

## 2022-08-06 DIAGNOSIS — I5043 Acute on chronic combined systolic (congestive) and diastolic (congestive) heart failure: Secondary | ICD-10-CM | POA: Diagnosis not present

## 2022-08-06 DIAGNOSIS — R778 Other specified abnormalities of plasma proteins: Secondary | ICD-10-CM | POA: Diagnosis not present

## 2022-08-06 DIAGNOSIS — F250 Schizoaffective disorder, bipolar type: Secondary | ICD-10-CM | POA: Diagnosis present

## 2022-08-06 DIAGNOSIS — E66813 Obesity, class 3: Secondary | ICD-10-CM | POA: Diagnosis present

## 2022-08-06 DIAGNOSIS — R7989 Other specified abnormal findings of blood chemistry: Secondary | ICD-10-CM | POA: Diagnosis present

## 2022-08-06 DIAGNOSIS — J449 Chronic obstructive pulmonary disease, unspecified: Secondary | ICD-10-CM | POA: Diagnosis not present

## 2022-08-06 DIAGNOSIS — I5023 Acute on chronic systolic (congestive) heart failure: Secondary | ICD-10-CM

## 2022-08-06 LAB — BLOOD GAS, VENOUS
Acid-base deficit: 0.1 mmol/L (ref 0.0–2.0)
Bicarbonate: 23.9 mmol/L (ref 20.0–28.0)
O2 Saturation: 69.1 %
Patient temperature: 37
pCO2, Ven: 36 mmHg — ABNORMAL LOW (ref 44–60)
pH, Ven: 7.43 (ref 7.25–7.43)
pO2, Ven: 40 mmHg (ref 32–45)

## 2022-08-06 LAB — CBC WITH DIFFERENTIAL/PLATELET
Abs Immature Granulocytes: 0.02 10*3/uL (ref 0.00–0.07)
Basophils Absolute: 0 10*3/uL (ref 0.0–0.1)
Basophils Relative: 1 %
Eosinophils Absolute: 0.1 10*3/uL (ref 0.0–0.5)
Eosinophils Relative: 1 %
HCT: 43.3 % (ref 36.0–46.0)
Hemoglobin: 14 g/dL (ref 12.0–15.0)
Immature Granulocytes: 0 %
Lymphocytes Relative: 30 %
Lymphs Abs: 1.8 10*3/uL (ref 0.7–4.0)
MCH: 30.2 pg (ref 26.0–34.0)
MCHC: 32.3 g/dL (ref 30.0–36.0)
MCV: 93.5 fL (ref 80.0–100.0)
Monocytes Absolute: 0.4 10*3/uL (ref 0.1–1.0)
Monocytes Relative: 7 %
Neutro Abs: 3.6 10*3/uL (ref 1.7–7.7)
Neutrophils Relative %: 61 %
Platelets: 221 10*3/uL (ref 150–400)
RBC: 4.63 MIL/uL (ref 3.87–5.11)
RDW: 15.1 % (ref 11.5–15.5)
WBC: 5.9 10*3/uL (ref 4.0–10.5)
nRBC: 0 % (ref 0.0–0.2)

## 2022-08-06 LAB — COMPREHENSIVE METABOLIC PANEL
ALT: 51 U/L — ABNORMAL HIGH (ref 0–44)
AST: 29 U/L (ref 15–41)
Albumin: 3.6 g/dL (ref 3.5–5.0)
Alkaline Phosphatase: 48 U/L (ref 38–126)
Anion gap: 8 (ref 5–15)
BUN: 20 mg/dL (ref 6–20)
CO2: 20 mmol/L — ABNORMAL LOW (ref 22–32)
Calcium: 9 mg/dL (ref 8.9–10.3)
Chloride: 112 mmol/L — ABNORMAL HIGH (ref 98–111)
Creatinine, Ser: 1.13 mg/dL — ABNORMAL HIGH (ref 0.44–1.00)
GFR, Estimated: >60 mL/min (ref >60)
Glucose, Bld: 91 mg/dL (ref 70–99)
Potassium: 4.1 mmol/L (ref 3.5–5.1)
Sodium: 140 mmol/L (ref 135–145)
Total Bilirubin: 0.7 mg/dL (ref 0.3–1.2)
Total Protein: 6.7 g/dL (ref 6.5–8.1)

## 2022-08-06 LAB — TROPONIN I (HIGH SENSITIVITY)
Troponin I (High Sensitivity): 47 ng/L — ABNORMAL HIGH (ref <18)
Troponin I (High Sensitivity): 38 ng/L — ABNORMAL HIGH (ref <18)

## 2022-08-06 LAB — D-DIMER, QUANTITATIVE: D-Dimer, Quant: 0.34 ug/mL-FEU (ref 0.00–0.50)

## 2022-08-06 LAB — BRAIN NATRIURETIC PEPTIDE: B Natriuretic Peptide: 733.9 pg/mL — ABNORMAL HIGH (ref 0.0–100.0)

## 2022-08-06 MED ORDER — PROCHLORPERAZINE EDISYLATE 10 MG/2ML IJ SOLN
10.0000 mg | Freq: Four times a day (QID) | INTRAMUSCULAR | Status: DC | PRN
  Administered 2022-08-06: 10 mg via INTRAVENOUS
  Filled 2022-08-06: qty 2

## 2022-08-06 MED ORDER — THIAMINE HCL 100 MG PO TABS
100.0000 mg | ORAL_TABLET | Freq: Every day | ORAL | Status: DC
  Administered 2022-08-06 – 2022-08-07 (×2): 100 mg via ORAL
  Filled 2022-08-06 (×2): qty 1

## 2022-08-06 MED ORDER — LAMOTRIGINE 25 MG PO TABS
25.0000 mg | ORAL_TABLET | Freq: Every day | ORAL | Status: DC
  Administered 2022-08-06 – 2022-08-07 (×2): 25 mg via ORAL
  Filled 2022-08-06 (×2): qty 1

## 2022-08-06 MED ORDER — NITROGLYCERIN 0.4 MG SL SUBL
0.4000 mg | SUBLINGUAL_TABLET | SUBLINGUAL | Status: DC | PRN
  Administered 2022-08-06: 0.4 mg via SUBLINGUAL
  Filled 2022-08-06: qty 1

## 2022-08-06 MED ORDER — VENLAFAXINE HCL ER 37.5 MG PO CP24
37.5000 mg | ORAL_CAPSULE | Freq: Every morning | ORAL | Status: DC
  Administered 2022-08-06 – 2022-08-07 (×2): 37.5 mg via ORAL
  Filled 2022-08-06 (×2): qty 1

## 2022-08-06 MED ORDER — HYDROCODONE-ACETAMINOPHEN 5-325 MG PO TABS
1.0000 | ORAL_TABLET | Freq: Four times a day (QID) | ORAL | Status: DC | PRN
  Administered 2022-08-06 – 2022-08-07 (×2): 1 via ORAL
  Filled 2022-08-06 (×2): qty 1

## 2022-08-06 MED ORDER — ENOXAPARIN SODIUM 60 MG/0.6ML IJ SOSY
60.0000 mg | PREFILLED_SYRINGE | INTRAMUSCULAR | Status: DC
  Administered 2022-08-06: 60 mg via SUBCUTANEOUS
  Filled 2022-08-06 (×2): qty 0.6

## 2022-08-06 MED ORDER — FAMOTIDINE IN NACL 20-0.9 MG/50ML-% IV SOLN
20.0000 mg | Freq: Once | INTRAVENOUS | Status: AC
  Administered 2022-08-06: 20 mg via INTRAVENOUS
  Filled 2022-08-06: qty 50

## 2022-08-06 MED ORDER — ACETAMINOPHEN 325 MG PO TABS
650.0000 mg | ORAL_TABLET | Freq: Four times a day (QID) | ORAL | Status: DC | PRN
  Administered 2022-08-07: 650 mg via ORAL
  Filled 2022-08-06: qty 2

## 2022-08-06 MED ORDER — PANTOPRAZOLE SODIUM 40 MG PO TBEC
40.0000 mg | DELAYED_RELEASE_TABLET | Freq: Every day | ORAL | Status: DC
  Administered 2022-08-06 – 2022-08-07 (×2): 40 mg via ORAL
  Filled 2022-08-06 (×2): qty 1

## 2022-08-06 MED ORDER — LOSARTAN POTASSIUM 25 MG PO TABS
12.5000 mg | ORAL_TABLET | Freq: Every day | ORAL | Status: DC
  Administered 2022-08-06 – 2022-08-07 (×2): 12.5 mg via ORAL
  Filled 2022-08-06 (×2): qty 1

## 2022-08-06 MED ORDER — FUROSEMIDE 10 MG/ML IJ SOLN
40.0000 mg | Freq: Once | INTRAMUSCULAR | Status: AC
  Administered 2022-08-06: 40 mg via INTRAVENOUS
  Filled 2022-08-06: qty 4

## 2022-08-06 MED ORDER — ACETAMINOPHEN 650 MG RE SUPP
650.0000 mg | Freq: Four times a day (QID) | RECTAL | Status: DC | PRN
  Filled 2022-08-06: qty 1

## 2022-08-06 MED ORDER — FENTANYL CITRATE PF 50 MCG/ML IJ SOSY
50.0000 ug | PREFILLED_SYRINGE | INTRAMUSCULAR | Status: DC | PRN
  Administered 2022-08-06: 50 ug via INTRAVENOUS
  Filled 2022-08-06: qty 1

## 2022-08-06 MED ORDER — QUETIAPINE FUMARATE 25 MG PO TABS
25.0000 mg | ORAL_TABLET | Freq: Every day | ORAL | Status: DC
Start: 2022-08-06 — End: 2022-08-07
  Filled 2022-08-06: qty 1

## 2022-08-06 MED ORDER — HYDRALAZINE HCL 50 MG PO TABS
50.0000 mg | ORAL_TABLET | Freq: Four times a day (QID) | ORAL | Status: DC | PRN
  Administered 2022-08-06: 50 mg via ORAL
  Filled 2022-08-06: qty 1

## 2022-08-06 MED ORDER — FUROSEMIDE 10 MG/ML IJ SOLN
40.0000 mg | Freq: Two times a day (BID) | INTRAMUSCULAR | Status: DC
  Administered 2022-08-06 – 2022-08-07 (×2): 40 mg via INTRAVENOUS
  Filled 2022-08-06 (×2): qty 4

## 2022-08-06 NOTE — ED Triage Notes (Signed)
Pt reports with [facility] since getting out of the hospital on Tuesday. Breathing is labored during triage.

## 2022-08-06 NOTE — H&P (Signed)
History and Physical    Patient: Michelle Strickland MPN:[id number] DOB: 12-Jan-1986 DOA: 08/06/2022 DOS: the patient was seen and examined on 08/06/2022 PCP: Pcp, No  Patient coming from: Home  Chief Complaint:  Chief Complaint  Patient presents with   Shortness of Breath   HPI: [name] is a 36 y.o. adult with medical history significant of asthma, bronchitis, chronic diarrhea, COPD, depression, seasonal allergies, fatty liver disease, class III obesity with a BMI of 45.19 kg/m, PTSD, borderline personality disorder, schizophrenia, tendinosis of the rotator cuff who was admitted from 07/31/2022 until 08/02/2022 due to chest pain and systolic CHF with mild elevation of troponin level who is coming to the emergency department with complaints of progressively worse dyspnea, pleuritic chest pain, orthopnea, PND and lower extremity edema since leaving the hospital.  No dizziness or diaphoresis.  She denied dietary indiscretions, but she has been trying to drink more than 8 glasses of water daily. He denied fever, chills, rhinorrhea, sore throat, wheezing or hemoptysis. No abdominal pain, nausea, emesis, diarrhea, constipation, melena or hematochezia.  No flank pain, dysuria, frequency or hematuria.  No polyuria, polydipsia, polyphagia or blurred vision.   ED course: Initial vital signs were temperature 98.1 F, pulse 59, respiration 24, BP 154/107 mmHg O2 sat 94% on room air.  The patient received furosemide 40 mg IVP.  I added famotidine 20 mg IVPB x1.  Lab work: CBC was normal.  Unremarkable D-dimer.  Troponin was 47 then 38 ng/L.  BNP 734 pg/mL.  Venous blood gas showed a decreased PCO2 at 36 mmHg, but was otherwise normal.  CMP showed a chloride of 112 and CO2 of 20 mmol/L with a normal anion gap.  Creatinine was 113 mg deciliter and ALT 51 units/L.  The rest of the CMP results were normal.  Imaging: Two-view chest radiograph showed cardiomegaly with pulmonary venous congestion, but no  frank pulmonary edema.   Review of Systems: As mentioned in the history of present illness. All other systems reviewed and are negative.  Past Medical History:  Diagnosis Date   Asthma    Bronchitis    Chronic diarrhea    COPD (chronic obstructive pulmonary disease) (HCC)    Depression    Environmental and seasonal allergies    Family history of adverse reaction to anesthesia    " My sister vomited and aspirated during surgery; they gave her too much anesthesia"   Fatty liver    H/O heartburn    Morbid obesity (HCC)    Osteoarthritis of right acromioclavicular joint    PTSD (post-traumatic stress disorder)    Schizophrenia (HCC)    Tendinosis of rotator cuff    impingement   Wears glasses    Past Surgical History:  Procedure Laterality Date   CHOLECYSTECTOMY     COLONOSCOPY     SHOULDER ARTHROSCOPY Right 01/31/2019   Procedure: RIGHT SHOULDER ARTHROSCOPIC DEBRIDEMENT ROTATOR CUFF; SUBACROMIAL DECOMPRESSION; DISTAL CLAVICLE EXCISION;  Surgeon: Jones Broom, MD;  Location: [facility] OR;  Service: Orthopedics;  Laterality: Right;   WISDOM TOOTH EXTRACTION     Social History:  reports that he has never smoked. He has never used smokeless tobacco. He reports current alcohol use. He reports that he does not currently use drugs.  Allergies  Allergen Reactions   Contrast Media [Iodinated Contrast Media] Nausea And Vomiting   Penicillins Hives, Itching and Other (See Comments)    Did it involve swelling of the face/tongue/throat, SOB, or low BP? No Did it involve sudden or  severe rash/hives, skin peeling, or any reaction on the inside of your mouth or nose? Yes Did you need to seek medical attention at a hospital or doctor's office? Yes When did it last happen?      Childhood allergy If all above answers are "NO", may proceed with cephalosporin use.    Toradol [Ketorolac Tromethamine] Nausea Only and Other (See Comments)    migraines   Tramadol Other (See Comments)    migraines    Nsaids    Watermelon [Citrullus Vulgaris] Other (See Comments)    Throat itches    Family History  Problem Relation Age of Onset   Heart disease Father    Heart disease Paternal Grandfather     Prior to Admission medications   Medication Sig Start Date End Date Taking? Authorizing Provider  diclofenac Sodium (VOLTAREN) 1 % GEL Apply 2 g topically 4 (four) times daily. Patient taking differently: Apply 2 g topically 4 (four) times daily as needed (pain). 07/24/22  Yes Pricilla Loveless, MD  HYDROcodone-acetaminophen (NORCO/VICODIN) 5-325 MG tablet Take 1 tablet by mouth every 6 (six) hours as needed for severe pain. 08/02/22  Yes Osvaldo Shipper, MD  lamoTRIgine (LAMICTAL) 25 MG tablet Take 25 mg by mouth daily. 04/25/22  Yes [provider]  losartan (COZAAR) 25 MG tablet Take 0.5 tablets (12.5 mg total) by mouth daily. 08/03/22  Yes Osvaldo Shipper, MD  metoprolol succinate (TOPROL-XL) 25 MG 24 hr tablet Take 0.5 tablets (12.5 mg total) by mouth daily. 08/03/22  Yes Osvaldo Shipper, MD  ondansetron (ZOFRAN-ODT) 8 MG disintegrating tablet Take 8 mg by mouth every 8 (eight) hours as needed for nausea or vomiting. 06/22/22  Yes [provider]  pantoprazole (PROTONIX) 40 MG tablet Take 1 tablet (40 mg total) by mouth daily for 14 days. 08/02/22 08/16/22 Yes Osvaldo Shipper, MD  promethazine (PHENERGAN) 25 MG tablet Take 25 mg by mouth every 6 (six) hours as needed for vomiting or nausea. 06/23/22  Yes [provider]  QUEtiapine (SEROQUEL) 25 MG tablet Take 25 mg by mouth at bedtime. 05/17/22  Yes [provider]  thiamine (VITAMIN B1) 100 MG tablet Take 1 tablet (100 mg total) by mouth daily. 08/02/22  Yes Osvaldo Shipper, MD  venlafaxine XR (EFFEXOR-XR) 37.5 MG 24 hr capsule Take 37.5 mg by mouth every morning. 06/09/22  Yes [provider]  testosterone enanthate (DELATESTRYL) 200 MG/ML injection Inject 200 mg into the muscle every 7 (seven) days. For IM use only     [provider]    Physical Exam: Vitals:   08/06/22 0800 08/06/22 0830 08/06/22 0905 08/06/22 0915  BP: (!) 148/95 (!) 137/102 (!) 155/105 (!) 145/93  Pulse: (!) 53 69 63 66  Resp: 20 (!) 31 (!) 25 (!) 22  Temp:      TempSrc:      SpO2: 99% 90% 96% 94%   Physical Exam Vitals and nursing note reviewed.  Constitutional:      General: He is awake.     Appearance: He is well-developed. He is obese. He is not ill-appearing.  HENT:     Head: Normocephalic and atraumatic.     Nose: No rhinorrhea.     Mouth/Throat:     Mouth: Mucous membranes are moist.  Eyes:     General: No scleral icterus.    Pupils: Pupils are equal, round, and reactive to light.  Neck:     Vascular: No JVD.  Cardiovascular:     Rate and Rhythm: Normal  rate and regular rhythm.  Pulmonary:     Effort: No tachypnea.     Breath sounds: Examination of the right-lower field reveals rales. Examination of the left-lower field reveals rales. Rales present. No wheezing or rhonchi.  Abdominal:     General: Bowel sounds are normal.     Palpations: Abdomen is soft.     Tenderness: There is no abdominal tenderness.  Musculoskeletal:     Cervical back: Neck supple.     Right lower leg: 1+ Edema present.     Left lower leg: 1+ Edema present.  Skin:    General: Skin is warm and dry.  Neurological:     General: No focal deficit present.     Mental Status: He is alert and oriented to person, place, and time.  Psychiatric:        Mood and Affect: Mood normal.        Behavior: Behavior normal. Behavior is cooperative.   Data Reviewed:  Results are pending, will review when available.  08/01/2022 echocardiogram IMPRESSIONS:   1. Left ventricular ejection fraction by 3D volume is 45 %. The left  ventricle has mildly decreased function. The left ventricle demonstrates  global hypokinesis. There is mild left ventricular hypertrophy. Left  ventricular diastolic parameters are  consistent with Grade II  diastolic dysfunction (pseudonormalization).   2. Right ventricular systolic function is normal. The right ventricular  size is normal.   3. Left atrial size was mildly dilated.   4. The mitral valve is normal in structure. Trivial mitral valve  regurgitation. No evidence of mitral stenosis.   5. The aortic valve is tricuspid. Aortic valve regurgitation is not  visualized. No aortic stenosis is present.   6. The inferior vena cava is dilated in size with <50% respiratory  variability, suggesting right atrial pressure of 15 mmHg.   Assessment and Plan: Principal Problem:   Acute on chronic combined systolic and diastolic heart failure (HCC) Observation/telemetry. Continue supplemental oxygen.   Sodium and fluid restriction. Continue furosemide 40 mg IVP twice daily. Monitor daily weights, intake and output. Continue losartan 12.5 mg p.o. daily. No beta-blocker due to acute decompensation. Check echocardiogram in a.m. Cardiology will be seeing in the morning.  Active Problems:   Schizoaffective disorder, bipolar type (HCC) Continue Lamictal 25 mg p.o. daily. Continue Seroquel 25 mg p.o. bedtime. Continue Effexor 37.5 mg p.o. daily.    Elevated troponin No uptrend. Likely demand ischemia.    Class 3 obesity (HCC)   Fatty liver Lifestyle modifications to achieve weight loss. Follow-up with primary care provider. Monitor LFTs periodically.    Advance Care Planning:   Code Status: Full Code   Consults:   Family Communication:   Severity of Illness: The appropriate patient status for this patient is OBSERVATION. Observation status is judged to be reasonable and necessary in order to provide the required intensity of service to ensure the patient's safety. The patient's presenting symptoms, physical exam findings, and initial radiographic and laboratory data in the context of their medical condition is felt to place them at decreased risk for further clinical deterioration.  Furthermore, it is anticipated that the patient will be medically stable for discharge from the hospital within 2 midnights of admission.   Author: Bobette Mo, MD 08/06/2022 10:04 AM  For on call review [email or web address].   This document was prepared using Dragon voice recognition software and may contain some unintended transcription errors.

## 2022-08-06 NOTE — ED Provider Notes (Signed)
[facility]-EMERGENCY DEPT Provider Note  CSN: [id number] Arrival date & time: 08/06/22 3403  Chief Complaint(s) Shortness of Breath  HPI Michelle Strickland is a 36 y.o. adult with history of mild systolic heart failure presenting to the emergency department with shortness of breath.  Patient reports that she was recently admitted to the hospital.  Since discharge, has gradually developed worsening shortness of breath.  She reports that this is worse with lying flat.  Denies leg swelling.  Reports compliance with home medications.  She reports nonproductive cough.  She also reports bilateral lower chest pain which is not pleuritic or exertional.  No nausea, vomiting, diaphoresis.  No syncope.  No back pain.  No abdominal pain.  Reports that she has had similar episodes in the past, which has improved with diuresis.   Past Medical History Past Medical History:  Diagnosis Date   Asthma    Bronchitis    Chronic diarrhea    COPD (chronic obstructive pulmonary disease) (HCC)    Depression    Environmental and seasonal allergies    Family history of adverse reaction to anesthesia    " My sister vomited and aspirated during surgery; they gave her too much anesthesia"   Fatty liver    H/O heartburn    Morbid obesity (HCC)    Osteoarthritis of right acromioclavicular joint    PTSD (post-traumatic stress disorder)    Schizophrenia (HCC)    Tendinosis of rotator cuff    impingement   Wears glasses    Patient Active Problem List   Diagnosis Date Noted   Acute on chronic combined systolic and diastolic heart failure (HCC) 08/06/2022   Class 3 obesity (HCC) 08/06/2022   Chest pain 07/31/2022   Elevated troponin 07/31/2022   Elevated BP without diagnosis of hypertension 07/31/2022   Headache 07/31/2022   Alcohol abuse 07/31/2022   Hypophosphatemia 07/31/2022   Mild intermittent asthma with acute exacerbation 07/21/2021   Viral hepatitis A without hepatic coma  11/18/2018   Severe episode of recurrent major depressive disorder, without psychotic features (HCC) 11/08/2018   Hirsutism 11/08/2018   Fatty liver 08/17/2017   Schizoaffective disorder, bipolar type (HCC) 02/14/2017   Posttraumatic stress disorder 10/01/2014   Multiple personality disorder (HCC) 08/09/2013   Borderline personality disorder (HCC) 08/09/2013   Morbid obesity with BMI of 45.0-49.9, adult (HCC) 02/01/2013   Female infertility 02/01/2013   Home Medication(s) Prior to Admission medications   Medication Sig Start Date End Date Taking? Authorizing Provider  diclofenac Sodium (VOLTAREN) 1 % GEL Apply 2 g topically 4 (four) times daily. Patient taking differently: Apply 2 g topically 4 (four) times daily as needed (pain). 07/24/22  Yes Pricilla Loveless, MD  HYDROcodone-acetaminophen (NORCO/VICODIN) 5-325 MG tablet Take 1 tablet by mouth every 6 (six) hours as needed for severe pain. 08/02/22  Yes Osvaldo Shipper, MD  lamoTRIgine (LAMICTAL) 25 MG tablet Take 25 mg by mouth daily. 04/25/22  Yes [provider]  losartan (COZAAR) 25 MG tablet Take 0.5 tablets (12.5 mg total) by mouth daily. 08/03/22  Yes Osvaldo Shipper, MD  metoprolol succinate (TOPROL-XL) 25 MG 24 hr tablet Take 0.5 tablets (12.5 mg total) by mouth daily. 08/03/22  Yes Osvaldo Shipper, MD  ondansetron (ZOFRAN-ODT) 8 MG disintegrating tablet Take 8 mg by mouth every 8 (eight) hours as needed for nausea or vomiting. 06/22/22  Yes [provider]  pantoprazole (PROTONIX) 40 MG tablet Take 1 tablet (40 mg total) by mouth daily for 14 days. 08/02/22  08/16/22 Yes Osvaldo Shipper, MD  promethazine (PHENERGAN) 25 MG tablet Take 25 mg by mouth every 6 (six) hours as needed for vomiting or nausea. 06/23/22  Yes [provider]  QUEtiapine (SEROQUEL) 25 MG tablet Take 25 mg by mouth at bedtime. 05/17/22  Yes [provider]  thiamine (VITAMIN B1) 100 MG tablet Take 1 tablet (100 mg total) by mouth daily.  08/02/22  Yes Osvaldo Shipper, MD  venlafaxine XR (EFFEXOR-XR) 37.5 MG 24 hr capsule Take 37.5 mg by mouth every morning. 06/09/22  Yes [provider]  testosterone enanthate (DELATESTRYL) 200 MG/ML injection Inject 200 mg into the muscle every 7 (seven) days. For IM use only    [provider]                                                                                                                                    Past Surgical History Past Surgical History:  Procedure Laterality Date   CHOLECYSTECTOMY     COLONOSCOPY     SHOULDER ARTHROSCOPY Right 01/31/2019   Procedure: RIGHT SHOULDER ARTHROSCOPIC DEBRIDEMENT ROTATOR CUFF; SUBACROMIAL DECOMPRESSION; DISTAL CLAVICLE EXCISION;  Surgeon: Jones Broom, MD;  Location: [facility] OR;  Service: Orthopedics;  Laterality: Right;   WISDOM TOOTH EXTRACTION     Family History Family History  Problem Relation Age of Onset   Heart disease Father    Heart disease Paternal Grandfather     Social History Social History   Tobacco Use   Smoking status: Never   Smokeless tobacco: Never  Vaping Use   Vaping Use: Never used  Substance Use Topics   Alcohol use: Yes    Comment: occasional   Drug use: Not Currently   Allergies Contrast media [iodinated contrast media], Penicillins, Toradol [ketorolac tromethamine], Tramadol, Nsaids, and Watermelon [citrullus vulgaris]  Review of Systems Review of Systems  All other systems reviewed and are negative.   Physical Exam Vital Signs  I have reviewed the triage vital signs BP (!) 146/70   Pulse (!) 58   Temp 98.1 F (36.7 C) (Oral)   Resp 19   SpO2 93%  Physical Exam Vitals and nursing note reviewed.  Constitutional:      General: He is not in acute distress.    Appearance: Normal appearance.  HENT:     Mouth/Throat:     Mouth: Mucous membranes are moist.  Cardiovascular:     Rate and Rhythm: Normal rate and regular rhythm.  Pulmonary:     Effort: No respiratory  distress.     Breath sounds: Normal breath sounds.     Comments: Moderate increased work of breathing Abdominal:     General: Abdomen is flat.     Palpations: Abdomen is soft.     Tenderness: There is no abdominal tenderness.  Skin:    General: Skin is warm and dry.     Capillary Refill: Capillary refill takes less than 2  seconds.  Neurological:     Mental Status: He is alert and oriented to person, place, and time. Mental status is at baseline.  Psychiatric:        Mood and Affect: Mood normal.        Behavior: Behavior normal.     ED Results and Treatments Labs (all labs ordered are listed, but only abnormal results are displayed) Labs Reviewed  COMPREHENSIVE METABOLIC PANEL - Abnormal; Notable for the following components:      Result Value   Chloride 112 (*)    CO2 20 (*)    Creatinine, Ser 1.13 (*)    ALT 51 (*)    All other components within normal limits  BRAIN NATRIURETIC PEPTIDE - Abnormal; Notable for the following components:   B Natriuretic Peptide 733.9 (*)    All other components within normal limits  BLOOD GAS, VENOUS - Abnormal; Notable for the following components:   pCO2, Ven 36 (*)    All other components within normal limits  TROPONIN I (HIGH SENSITIVITY) - Abnormal; Notable for the following components:   Troponin I (High Sensitivity) 47 (*)    All other components within normal limits  TROPONIN I (HIGH SENSITIVITY) - Abnormal; Notable for the following components:   Troponin I (High Sensitivity) 38 (*)    All other components within normal limits  CBC WITH DIFFERENTIAL/PLATELET  D-DIMER, QUANTITATIVE  MAGNESIUM                                                                                                                          Radiology DG Chest 2 View  Result Date: 08/06/2022 CLINICAL DATA:  36 year old female with history of worsening shortness of breath. EXAM: CHEST - 2 VIEW COMPARISON:  Chest x-ray 07/31/2022. FINDINGS: Lung volumes are  normal. No consolidative airspace disease. No pleural effusions. No pneumothorax. Cephalization of the pulmonary vasculature, without frank pulmonary edema. Heart size is mildly enlarged. Upper mediastinal contours are within normal limits. IMPRESSION: 1. Cardiomegaly with pulmonary venous congestion, but no frank pulmonary edema. Electronically Signed   By: Trudie Reed M.D.   On: 08/06/2022 07:17    Pertinent labs & imaging results that were available during my care of the patient were reviewed by me and considered in my medical decision making (see MDM for details).  Medications Ordered in ED Medications  losartan (COZAAR) tablet 12.5 mg (12.5 mg Oral Given 08/06/22 1002)  enoxaparin (LOVENOX) injection 60 mg (has no administration in time range)  furosemide (LASIX) injection 40 mg (has no administration in time range)  acetaminophen (TYLENOL) tablet 650 mg (has no administration in time range)    Or  acetaminophen (TYLENOL) suppository 650 mg (has no administration in time range)  prochlorperazine (COMPAZINE) injection 10 mg (has no administration in time range)  HYDROcodone-acetaminophen (NORCO/VICODIN) 5-325 MG per tablet 1 tablet (has no administration in time range)  lamoTRIgine (LAMICTAL) tablet 25 mg (has no administration in time range)  pantoprazole (PROTONIX) EC  tablet 40 mg (has no administration in time range)  QUEtiapine (SEROQUEL) tablet 25 mg (has no administration in time range)  thiamine (VITAMIN B1) tablet 100 mg (has no administration in time range)  venlafaxine XR (EFFEXOR-XR) 24 hr capsule 37.5 mg (has no administration in time range)  furosemide (LASIX) injection 40 mg (40 mg Intravenous Given 08/06/22 0958)                                                                                                                                     Procedures Procedures  (including critical care time)  Medical Decision Making / ED Course   MDM:  36 year old patient  presenting to the emergency department with shortness of breath.  Patient well-appearing, but with increased work of breathing.  No acute distress.  Vitals reassuring with no hypoxia.  Is mildly tachypneic.  Differential includes CHF although patient has no obvious signs of volume overload on physical exam, PE, will check D-dimer although lower concern without tachycardia or hypoxia, doubt pneumonia or pneumothorax with reassuring chest x-ray and no infectious symptoms.  No wheezing to suggest bronchitis, asthma, will check VBG.  Will check labs to evaluate for anemia.  Clinical Course as of 08/06/22 1032  Sat Aug 06, 2022  0937 B Natriuretic Peptide(!): 733.9 BNP elevated. Suspect mild CHF exacerbation. Will treat with lasix. Discussed with Dr. Robb Matar, who will admit. [WS]    Clinical Course User Index [WS] Suezanne Jacquet Jerilee Field, MD     Additional history obtained: -Additional history obtained from medical record -External records from outside source obtained and reviewed including: Chart review including previous notes, labs, imaging, consultation notes   Lab Tests: -I ordered, reviewed, and interpreted labs.   The pertinent results include:   Labs Reviewed  COMPREHENSIVE METABOLIC PANEL - Abnormal; Notable for the following components:      Result Value   Chloride 112 (*)    CO2 20 (*)    Creatinine, Ser 1.13 (*)    ALT 51 (*)    All other components within normal limits  BRAIN NATRIURETIC PEPTIDE - Abnormal; Notable for the following components:   B Natriuretic Peptide 733.9 (*)    All other components within normal limits  BLOOD GAS, VENOUS - Abnormal; Notable for the following components:   pCO2, Ven 36 (*)    All other components within normal limits  TROPONIN I (HIGH SENSITIVITY) - Abnormal; Notable for the following components:   Troponin I (High Sensitivity) 47 (*)    All other components within normal limits  TROPONIN I (HIGH SENSITIVITY) - Abnormal; Notable for the  following components:   Troponin I (High Sensitivity) 38 (*)    All other components within normal limits  CBC WITH DIFFERENTIAL/PLATELET  D-DIMER, QUANTITATIVE  MAGNESIUM      EKG   EKG Interpretation  Date/Time:  Saturday August 06 2022 06:46:49 EDT Ventricular Rate:  59 PR Interval:  143 QRS Duration: 96 QT Interval:  379 QTC Calculation: 376 R Axis:   115 Text Interpretation: Sinus rhythm LAE, consider biatrial enlargement Right axis deviation No significant change was found Confirmed by Molpus, John ([id number]) on 08/06/2022 7:05:56 AM         Imaging Studies ordered: I ordered imaging studies including chest x-ray I independently visualized and interpreted imaging. I agree with the radiologist interpretation   Medicines ordered and prescription drug management: Meds ordered this encounter  Medications   furosemide (LASIX) injection 40 mg   losartan (COZAAR) tablet 12.5 mg   enoxaparin (LOVENOX) injection 60 mg   furosemide (LASIX) injection 40 mg   OR Linked Order Group    acetaminophen (TYLENOL) tablet 650 mg    acetaminophen (TYLENOL) suppository 650 mg   prochlorperazine (COMPAZINE) injection 10 mg   HYDROcodone-acetaminophen (NORCO/VICODIN) 5-325 MG per tablet 1 tablet   lamoTRIgine (LAMICTAL) tablet 25 mg   pantoprazole (PROTONIX) EC tablet 40 mg   QUEtiapine (SEROQUEL) tablet 25 mg   thiamine (VITAMIN B1) tablet 100 mg   venlafaxine XR (EFFEXOR-XR) 24 hr capsule 37.5 mg    -I have reviewed the patients home medicines and have made adjustments as needed    Consultations Obtained: I requested consultation with the hospitalist,  and discussed lab and imaging findings as well as pertinent plan - they recommend: Admit   Cardiac Monitoring: The patient was maintained on a cardiac monitor.  I personally viewed and interpreted the cardiac monitored which showed an underlying rhythm of: Normal sinus rhythm  Social Determinants of Health:  Factors impacting  patients care include: LBGTQ status   Reevaluation: After the interventions noted above, I reevaluated the patient and found that they have :improved  Co morbidities that complicate the patient evaluation  Past Medical History:  Diagnosis Date   Asthma    Bronchitis    Chronic diarrhea    COPD (chronic obstructive pulmonary disease) (HCC)    Depression    Environmental and seasonal allergies    Family history of adverse reaction to anesthesia    " My sister vomited and aspirated during surgery; they gave her too much anesthesia"   Fatty liver    H/O heartburn    Morbid obesity (HCC)    Osteoarthritis of right acromioclavicular joint    PTSD (post-traumatic stress disorder)    Schizophrenia (HCC)    Tendinosis of rotator cuff    impingement   Wears glasses       Dispostion: I considered admission for this patient, given dyspnea, apnea, symptomatic burden, will admit for inpatient diuresis.     Final Clinical Impression(s) / ED Diagnoses Final diagnoses:  Acute on chronic systolic congestive heart failure (HCC)  Dyspnea on exertion     This chart was dictated using voice recognition software.  Despite best efforts to proofread,  errors can occur which can change the documentation meaning.    Lonell Grandchild, MD 08/06/22 1032

## 2022-08-07 DIAGNOSIS — I5043 Acute on chronic combined systolic (congestive) and diastolic (congestive) heart failure: Secondary | ICD-10-CM | POA: Diagnosis not present

## 2022-08-07 LAB — BASIC METABOLIC PANEL
Anion gap: 7 (ref 5–15)
BUN: 17 mg/dL (ref 6–20)
CO2: 26 mmol/L (ref 22–32)
Calcium: 8.8 mg/dL — ABNORMAL LOW (ref 8.9–10.3)
Chloride: 108 mmol/L (ref 98–111)
Creatinine, Ser: 1.23 mg/dL — ABNORMAL HIGH (ref 0.44–1.00)
GFR, Estimated: 59 mL/min — ABNORMAL LOW (ref >60)
Glucose, Bld: 91 mg/dL (ref 70–99)
Potassium: 3.9 mmol/L (ref 3.5–5.1)
Sodium: 141 mmol/L (ref 135–145)

## 2022-08-07 MED ORDER — FUROSEMIDE 40 MG PO TABS: 40.0000 mg | ORAL_TABLET | Freq: Every day | ORAL | 0 refills | Status: DC

## 2022-08-07 NOTE — Discharge Summary (Signed)
Physician Discharge Summary  Michelle Strickland WUJ:[id number] DOB: 03-04-1986 DOA: 08/06/2022  PCP: Pcp, No  Admit date: 08/06/2022 Discharge date: 08/07/2022  Admitted From: Home Disposition:  Home   Recommendations for Outpatient Follow-up:  Follow up with PCP.  Encouraged patient to look on his insurance website to find out which PCP he can establish with. Follow up with cardiology as scheduled   Discharge Condition: Stable CODE STATUS: Full code Diet recommendation: Heart healthy, fluid restriction 2 L  Brief/Interim Summary: From H&P by Dr. Robb Matar: "Michelle Strickland is a 36 y.o. adult with medical history significant of asthma, bronchitis, chronic diarrhea, COPD, depression, seasonal allergies, fatty liver disease, class III obesity with a BMI of 45.19 kg/m, PTSD, borderline personality disorder, schizophrenia, tendinosis of the rotator cuff who was admitted from 07/31/2022 until 08/02/2022 due to chest pain and systolic CHF with mild elevation of troponin level who is coming to the emergency department with complaints of progressively worse dyspnea, pleuritic chest pain, orthopnea, PND and lower extremity edema since leaving the hospital.  No dizziness or diaphoresis.  She denied dietary indiscretions, but she has been trying to drink more than 8 glasses of water daily. He denied fever, chills, rhinorrhea, sore throat, wheezing or hemoptysis. No abdominal pain, nausea, emesis, diarrhea, constipation, melena or hematochezia.  No flank pain, dysuria, frequency or hematuria.  No polyuria, polydipsia, polyphagia or blurred vision.    ED course: Initial vital signs were temperature 98.1 F, pulse 59, respiration 24, BP 154/107 mmHg O2 sat 94% on room air.  The patient received furosemide 40 mg IVP.  I added famotidine 20 mg IVPB x1.   Lab work: CBC was normal.  Unremarkable D-dimer.  Troponin was 47 then 38 ng/L.  BNP 734 pg/mL.  Venous blood gas showed a decreased PCO2 at 36 mmHg, but was  otherwise normal.  CMP showed a chloride of 112 and CO2 of 20 mmol/L with a normal anion gap.  Creatinine was 113 mg deciliter and ALT 51 units/L.  The rest of the CMP results were normal.   Imaging: Two-view chest radiograph showed cardiomegaly with pulmonary venous congestion, but no frank pulmonary edema."  Patient seen with family member at bedside.  He states that his symptoms have greatly improved, had some sharp chest pains bilaterally, described as pain when lying for long distance.  Breathing has improved.  Swelling around his abdomen has also improved.  He reports that he is constantly thirsty and drinks water and juice all day long, as well as 1 glass of wine. He is adherent to low sodium diet and stays away from processed foods and take out.   Discharge Diagnoses:  Principal Problem:   Acute on chronic combined systolic and diastolic heart failure (HCC) Active Problems:   Schizoaffective disorder, bipolar type (HCC)   Fatty liver   Elevated troponin   Class 3 obesity (HCC)   Acute on chronic combined systolic and diastolic heart failure NICM -BNP 733.9 -Recent echocardiogram done revealed EF 45%, left ventricle global hypokinesis, grade 2 diastolic dysfunction -Was given IV Lasix --> PO for discharge  -Continue losartan, toprol  -Follow up with cardiology as scheduled 09/01/22  -Non compliant with fluid restriction. This was discussed in depth with patient and family member at bedside. Encourage daily weights as well.   Schizoaffective disorder, bipolar -Prior to admission medications include Lamictal, Seroquel, Effexor  Demand ischemia  -Troponin 47 >> 38  -Had recent coronary CT was negative, no evidence of CAD, coronary calcium score  0  Obesity -Estimated body mass index is 45.51 kg/m as calculated from the following:   Height as of this encounter: 5\' 6"  (1.676 m).   Weight as of this encounter: 127.9 kg.   Discharge Instructions  Discharge Instructions      (HEART FAILURE PATIENTS) Call MD:  Anytime you have any of the following symptoms: 1) 3 pound weight gain in 24 hours or 5 pounds in 1 week 2) shortness of breath, with or without a dry hacking cough 3) swelling in the hands, feet or stomach 4) if you have to sleep on extra pillows at night in order to breathe.   Complete by: As directed    Call MD for:  difficulty breathing, headache or visual disturbances   Complete by: As directed    Call MD for:  extreme fatigue   Complete by: As directed    Call MD for:  persistant dizziness or light-headedness   Complete by: As directed    Call MD for:  persistant nausea and vomiting   Complete by: As directed    Call MD for:  severe uncontrolled pain   Complete by: As directed    Call MD for:  temperature >100.4   Complete by: As directed    Diet - low sodium heart healthy   Complete by: As directed    Discharge instructions   Complete by: As directed    You were cared for by a hospitalist during your hospital stay. If you have any questions about your discharge medications or the care you received while you were in the hospital after you are discharged, you can call the unit and ask to speak with the hospitalist on call if the hospitalist that took care of you is not available. Once you are discharged, your primary care physician will handle any further medical issues. Please note that NO REFILLS for any discharge medications will be authorized once you are discharged, as it is imperative that you return to your primary care physician (or establish a relationship with a primary care physician if you do not have one) for your aftercare needs so that they can reassess your need for medications and monitor your lab values.   Discharge instructions   Complete by: As directed    All 3 of your psych medications (Lamictal, Seroquel, Effexor) have a side effect of xerostomia, which is dry mouth. This may be contributing to your symptoms of excessive thirst.  Discuss with your psychiatrist.   Increase activity slowly   Complete by: As directed       Allergies as of 08/07/2022       Reactions   Contrast Media [iodinated Contrast Media] Nausea And Vomiting   Penicillins Hives, Itching, Other (See Comments)   Did it involve swelling of the face/tongue/throat, SOB, or low BP? No Did it involve sudden or severe rash/hives, skin peeling, or any reaction on the inside of your mouth or nose? Yes Did you need to seek medical attention at a hospital or doctor's office? Yes When did it last happen?      Childhood allergy If all above answers are "NO", may proceed with cephalosporin use.   Toradol [ketorolac Tromethamine] Nausea Only, Other (See Comments)   migraines   Tramadol Other (See Comments)   migraines   Nsaids    Watermelon [citrullus Vulgaris] Other (See Comments)   Throat itches        Medication List     TAKE these medications  diclofenac Sodium 1 % Gel Commonly known as: Voltaren Apply 2 g topically 4 (four) times daily. What changed:  when to take this reasons to take this   furosemide 40 MG tablet Commonly known as: Lasix Take 1 tablet (40 mg total) by mouth daily.   HYDROcodone-acetaminophen 5-325 MG tablet Commonly known as: NORCO/VICODIN Take 1 tablet by mouth every 6 (six) hours as needed for severe pain.   lamoTRIgine 25 MG tablet Commonly known as: LAMICTAL Take 25 mg by mouth daily.   losartan 25 MG tablet Commonly known as: COZAAR Take 0.5 tablets (12.5 mg total) by mouth daily.   metoprolol succinate 25 MG 24 hr tablet Commonly known as: TOPROL-XL Take 0.5 tablets (12.5 mg total) by mouth daily.   ondansetron 8 MG disintegrating tablet Commonly known as: ZOFRAN-ODT Take 8 mg by mouth every 8 (eight) hours as needed for nausea or vomiting.   pantoprazole 40 MG tablet Commonly known as: PROTONIX Take 1 tablet (40 mg total) by mouth daily for 14 days.   promethazine 25 MG tablet Commonly known  as: PHENERGAN Take 25 mg by mouth every 6 (six) hours as needed for vomiting or nausea.   QUEtiapine 25 MG tablet Commonly known as: SEROQUEL Take 25 mg by mouth at bedtime.   testosterone enanthate 200 MG/ML injection Commonly known as: DELATESTRYL Inject 200 mg into the muscle every 7 (seven) days. For IM use only   thiamine 100 MG tablet Commonly known as: VITAMIN B1 Take 1 tablet (100 mg total) by mouth daily.   venlafaxine XR 37.5 MG 24 hr capsule Commonly known as: EFFEXOR-XR Take 37.5 mg by mouth every morning.        Follow-up Information     Jodelle Red, MD. Go on 09/01/2022.   Specialty: Cardiology Contact information: [address] [place] [address] [phone]         Find out from your insurance company on getting a new PCP Follow up.                 Allergies  Allergen Reactions   Contrast Media [Iodinated Contrast Media] Nausea And Vomiting   Penicillins Hives, Itching and Other (See Comments)    Did it involve swelling of the face/tongue/throat, SOB, or low BP? No Did it involve sudden or severe rash/hives, skin peeling, or any reaction on the inside of your mouth or nose? Yes Did you need to seek medical attention at a hospital or doctor's office? Yes When did it last happen?      Childhood allergy If all above answers are "NO", may proceed with cephalosporin use.    Toradol [Ketorolac Tromethamine] Nausea Only and Other (See Comments)    migraines   Tramadol Other (See Comments)    migraines   Nsaids    Watermelon [Citrullus Vulgaris] Other (See Comments)    Throat itches    Consultations: None    Procedures/Studies: DG Chest 2 View  Result Date: 08/06/2022 CLINICAL DATA:  36 year old female with history of worsening shortness of breath. EXAM: CHEST - 2 VIEW COMPARISON:  Chest x-ray 07/31/2022. FINDINGS: Lung volumes are normal. No consolidative airspace disease. No pleural effusions. No pneumothorax.  Cephalization of the pulmonary vasculature, without frank pulmonary edema. Heart size is mildly enlarged. Upper mediastinal contours are within normal limits. IMPRESSION: 1. Cardiomegaly with pulmonary venous congestion, but no frank pulmonary edema. Electronically Signed   By: Trudie Reed M.D.   On: 08/06/2022 07:17   CT CORONARY MORPH W/CTA COR  W/SCORE W/CA W/CM &/OR WO/CM  Addendum Date: 08/02/2022   ADDENDUM REPORT: 08/02/2022 14:40 CLINICAL DATA:  This over-read does not include interpretation of cardiac or coronary anatomy or pathology. The coronary CTA interpretation by the cardiologist is attached. COMPARISON:  None available. FINDINGS: No suspicious nodules, masses, or infiltrates are identified in the visualized portion of the lungs. Mild bilateral pleural-parenchymal scarring noted. No pleural fluid seen. The visualized portions of the mediastinum and chest wall are unremarkable. IMPRESSION: No significant non-cardiac abnormality identified. Electronically Signed   By: Danae Orleans M.D.   On: 08/02/2022 14:40   Result Date: 08/02/2022 HISTORY: Chest pain EXAM: Cardiac/Coronary CT TECHNIQUE: The patient was scanned on a [employer]. PROTOCOL: A 130 kV prospective scan was triggered in the descending thoracic aorta at 111 HU's. Axial non-contrast 3 mm slices were carried out through the heart. The data set was analyzed on a dedicated work station and scored using the Agatston method. Gantry rotation speed was 250 msecs and collimation was .6 mm. Heart rate was optimized medically and sl NTG was given. The 3D data set was reconstructed in 5% intervals of the 35-75 % of the R-R cycle. Systolic and diastolic phases were analyzed on a dedicated work station using MPR, MIP and VRT modes. The patient received OMNIPAQUE IOHEXOL 350 MG/ML SOLN of contrast. FINDINGS: Coronary calcium score: The patient's coronary artery calcium score is 0, which places the patient in the 0 percentile.  Coronary arteries: Normal coronary origins.  Right dominance. Right Coronary Artery: Normal caliber vessel, gives rise to PDA and PLB. No significant plaque or stenosis. Left Main Coronary Artery: Normal caliber vessel. No significant plaque or stenosis. Left Anterior Descending Coronary Artery: Normal caliber vessel. No significant plaque or stenosis. Gives rise to two small diagonal branches. Left Circumflex Artery: Normal caliber vessel. No significant plaque or stenosis. Gives rise to two small OM branches. Aorta: Normal size, 30 mm at the mid ascending aorta (level of the PA bifurcation) measured double oblique. No aortic atherosclerosis. No dissection seen in visualized portions of the aorta. Aortic Valve: No calcifications. Trileaflet. Other findings: Normal pulmonary vein drainage into the left atrium. Normal left atrial appendage. There is mixing artifact at distal LAA, without delayed imaging cannot evaluate/exclude thrombus. Dilated size of the pulmonary artery, measuring 35 mm. Normal appearance of the pericardium. IMPRESSION: 1. No evidence of CAD, CADRADS = 0. 2. Coronary calcium score of 0. This was 0 percentile for age and sex matched control. 3. Normal coronary origin with right dominance. 4. Dilated size of the pulmonary artery, measuring 35 mm. INTERPRETATION: 1. CAD-RADS 0: No evidence of CAD (0%). Consider non-atherosclerotic causes of chest pain. 2. CAD-RADS 1: Minimal non-obstructive CAD (0-24%). Consider non-atherosclerotic causes of chest pain. Consider preventive therapy and risk factor modification. 3. CAD-RADS 2: Mild non-obstructive CAD (25-49%). Consider non-atherosclerotic causes of chest pain. Consider preventive therapy and risk factor modification. 4. CAD-RADS 3: Moderate stenosis (50-69%). Consider symptom-guided anti-ischemic pharmacotherapy as well as risk factor modification per guideline directed care. Additional analysis with CT FFR will be submitted. 5. CAD-RADS 4: Severe  stenosis. (70-99% or > 50% left main). Cardiac catheterization or CT FFR is recommended. Consider symptom-guided anti-ischemic pharmacotherapy as well as risk factor modification per guideline directed care. Invasive coronary angiography recommended with revascularization per published guideline statements. 6. CAD-RADS 5: Total coronary occlusion (100%). Consider cardiac catheterization or viability assessment. Consider symptom-guided anti-ischemic pharmacotherapy as well as risk factor modification per guideline directed care. 7. CAD-RADS N:  Non-diagnostic study. Obstructive CAD can't be excluded. Alternative evaluation is recommended. Electronically Signed: By: Jodelle Red M.D. On: 08/02/2022 12:54   ECHOCARDIOGRAM COMPLETE  Result Date: 08/01/2022    ECHOCARDIOGRAM REPORT   Patient Name:   Michelle Strickland Date of Exam: 08/01/2022 Medical Rec #:  [id number]           Height:       66.0 in Accession #:    [id number]          Weight:       280.0 lb Date of Birth:  1986/08/30           BSA:          2.307 m Patient Age:    36 years            BP:           130/99 mmHg Patient Gender: F                   HR:           67 bpm. Exam Location:  Inpatient Procedure: 2D Echo, 3D Echo, Cardiac Doppler, Color Doppler and Intracardiac            Opacification Agent Indications:    R07.9* Chest pain, unspecified  History:        Patient has no prior history of Echocardiogram examinations.                 Signs/Symptoms:Chest Pain. ETOH.  Sonographer:    Sheralyn Boatman RDCS Referring Phys: [id number] ANASTASSIA DOUTOVA  Sonographer Comments: Technically difficult study due to poor echo windows and patient is morbidly obese. Image acquisition challenging due to patient body habitus. Delay sending study IMPRESSIONS  1. Left ventricular ejection fraction by 3D volume is 45 %. The left ventricle has mildly decreased function. The left ventricle demonstrates global hypokinesis. There is mild left ventricular hypertrophy. Left  ventricular diastolic parameters are consistent with Grade II diastolic dysfunction (pseudonormalization).  2. Right ventricular systolic function is normal. The right ventricular size is normal.  3. Left atrial size was mildly dilated.  4. The mitral valve is normal in structure. Trivial mitral valve regurgitation. No evidence of mitral stenosis.  5. The aortic valve is tricuspid. Aortic valve regurgitation is not visualized. No aortic stenosis is present.  6. The inferior vena cava is dilated in size with <50% respiratory variability, suggesting right atrial pressure of 15 mmHg. FINDINGS  Left Ventricle: Left ventricular ejection fraction by 3D volume is 45 %. The left ventricle has mildly decreased function. The left ventricle demonstrates global hypokinesis. Definity contrast agent was given IV to delineate the left ventricular endocardial borders. The left ventricular internal cavity size was normal in size. There is mild left ventricular hypertrophy. Left ventricular diastolic parameters are consistent with Grade II diastolic dysfunction (pseudonormalization). Right Ventricle: The right ventricular size is normal. No increase in right ventricular wall thickness. Right ventricular systolic function is normal. Left Atrium: Left atrial size was mildly dilated. Right Atrium: Right atrial size was normal in size. Pericardium: There is no evidence of pericardial effusion. Mitral Valve: The mitral valve is normal in structure. Trivial mitral valve regurgitation. No evidence of mitral valve stenosis. Tricuspid Valve: The tricuspid valve is normal in structure. Tricuspid valve regurgitation is not demonstrated. No evidence of tricuspid stenosis. Aortic Valve: The aortic valve is tricuspid. Aortic valve regurgitation is not visualized. No aortic stenosis is present. Pulmonic Valve: The pulmonic valve was  normal in structure. Pulmonic valve regurgitation is not visualized. No evidence of pulmonic stenosis. Aorta: The  aortic root is normal in size and structure. Venous: The inferior vena cava is dilated in size with less than 50% respiratory variability, suggesting right atrial pressure of 15 mmHg. IAS/Shunts: No atrial level shunt detected by color flow Doppler.  LEFT VENTRICLE PLAX 2D LVIDd:         5.20 cm         Diastology LVIDs:         3.60 cm         LV e' medial:    4.43 cm/s LV PW:         1.10 cm         LV E/e' medial:  12.3 LV IVS:        1.20 cm         LV e' lateral:   6.90 cm/s LVOT diam:     1.90 cm         LV E/e' lateral: 7.9 LV SV:         56 LV SV Index:   24 LVOT Area:     2.84 cm        3D Volume EF                                LV 3D EF:    Left                                             ventricul LV Volumes (MOD)                            ar LV vol d, MOD    112.0 ml                   ejection A2C:                                        fraction LV vol d, MOD    90.6 ml                    by 3D A4C:                                        volume is LV vol s, MOD    51.2 ml                    45 %. A2C: LV vol s, MOD    36.1 ml A4C:                           3D Volume EF: LV SV MOD A2C:   60.8 ml       3D EF:        45 % LV SV MOD A4C:   90.6 ml       LV EDV:       149 ml LV SV MOD BP:    56.5 ml  LV ESV:       83 ml                                LV SV:        66 ml RIGHT VENTRICLE             IVC RV S prime:     12.80 cm/s  IVC diam: 2.70 cm TAPSE (M-mode): 2.0 cm LEFT ATRIUM              Index        RIGHT ATRIUM           Index LA diam:        4.50 cm  1.95 cm/m   RA Area:     17.20 cm LA Vol (A2C):   106.0 ml 45.94 ml/m  RA Volume:   43.60 ml  18.90 ml/m LA Vol (A4C):   58.9 ml  25.53 ml/m LA Biplane Vol: 84.0 ml  36.41 ml/m  AORTIC VALVE LVOT Vmax:   119.00 cm/s LVOT Vmean:  73.700 cm/s LVOT VTI:    0.196 m  AORTA Ao Root diam: 3.30 cm Ao Asc diam:  3.00 cm MITRAL VALVE MV Area (PHT): 3.31 cm    SHUNTS MV Decel Time: 229 msec    Systemic VTI:  0.20 m MV E velocity: 54.50 cm/s  Systemic  Diam: 1.90 cm MV A velocity: 36.00 cm/s MV E/A ratio:  1.51 Donato Schultz MD Electronically signed by Donato Schultz MD Signature Date/Time: 08/01/2022/2:51:08 PM    Final    CT HEAD WO CONTRAST ( )  Result Date: 07/31/2022 CLINICAL DATA:  Headache, sudden, severe EXAM: CT HEAD WITHOUT CONTRAST TECHNIQUE: Contiguous axial images were obtained from the base of the skull through the vertex without intravenous contrast. RADIATION DOSE REDUCTION: This exam was performed according to the departmental dose-optimization program which includes automated exposure control, adjustment of the mA and/or kV according to patient size and/or use of iterative reconstruction technique. COMPARISON:  None Available. FINDINGS: Brain: Normal anatomic configuration. No abnormal intra or extra-axial mass lesion or fluid collection. No abnormal mass effect or midline shift. No evidence of acute intracranial hemorrhage or infarct. Ventricular size is normal. Cerebellum unremarkable. Vascular: Unremarkable Skull: Intact Sinuses/Orbits: Paranasal sinuses are clear. Orbits are unremarkable. Other: Mastoid air cells and middle ear cavities are clear. IMPRESSION: No acute intracranial abnormality. Electronically Signed   By: Helyn Numbers M.D.   On: 07/31/2022 23:16   DG Chest 2 View  Result Date: 07/31/2022 CLINICAL DATA:  Chest pain.  Headache. EXAM: CHEST - 2 VIEW COMPARISON:  None Available. FINDINGS: Cardiac silhouette is normal in size. No mediastinal or hilar masses. No evidence of adenopathy. Clear lungs.  No pleural effusion or pneumothorax. Skeletal structures are unremarkable. IMPRESSION: No active cardiopulmonary disease. Electronically Signed   By: Amie Portland M.D.   On: 07/31/2022 13:57   DG Foot Complete Right  Addendum Date: 07/22/2022   ADDENDUM REPORT: 07/22/2022 21:07 ADDENDUM: Images were reviewed again. There is 1.5 cm smoothly marginated calcification adjacent to the posteromedial aspect of right navicular. 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 There is no significant soft tissue swelling over the medial aspect of the hindfoot. Accessory navicular could cause clinical symptoms. Please correlate with clinical physical examination findings. If there are focal symptoms in this region, follow-up MRI may be considered. Electronically Signed   By: Harlan Stains.D.  On: 07/22/2022 21:07   Result Date: 07/22/2022 CLINICAL DATA:  Pain x1 week EXAM: RIGHT FOOT COMPLETE - 3+ VIEW COMPARISON:  None Available. FINDINGS: There is no evidence of fracture or dislocation. There is no evidence of arthropathy or other focal bone abnormality. Soft tissues are unremarkable. IMPRESSION: No fracture or dislocation is seen in right foot. Electronically Signed: By: Ernie Avena M.D. On: 07/17/2022 12:14   DG Foot Complete Right  Result Date: 07/22/2022 CLINICAL DATA:  Right foot pain.  No known injury. EXAM: RIGHT FOOT COMPLETE - 3+ VIEW COMPARISON:  Right foot radiograph dated 07/17/2022. FINDINGS: There is no acute fracture or dislocation. The bones are well mineralized. There is a moderate sized os navicularis. There is minimal soft tissue edema over the os navicularis. No radiopaque foreign object or soft tissue gas. IMPRESSION: 1. No acute osseous pathology. 2. 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 Electronically Signed   By: Elgie Collard M.D.   On: 07/22/2022 19:23   DG Finger Thumb Right  Result Date: 07/17/2022 CLINICAL DATA:  Trauma, fall 2 weeks earlier, pain EXAM: RIGHT THUMB 2+V COMPARISON:  None Available. FINDINGS: No recent fracture or dislocation is seen. Small smoothly marginated calcifications in the palmar aspect of first metacarpophalangeal joint and the intercarpal joint of the right thumb most likely suggest sesamoid bones. There are no opaque foreign bodies. IMPRESSION: No fracture or dislocation is seen in right thumb. Electronically Signed   By: Ernie Avena M.D.   On:  07/17/2022 12:12      Discharge Exam: Vitals:   08/06/22 2005 08/07/22 0455  BP: 123/74 (!) 134/98  Pulse: 78 (!) 54  Resp: 17 18  Temp: 98.3 F (36.8 C) 98.6 F (37 C)  SpO2: 97% 96%    General: Pt is alert, awake, not in acute distress Cardiovascular: RRR, S1/S2 +, no edema Respiratory: CTA bilaterally, no wheezing, no rhonchi, no respiratory distress, no conversational dyspnea, on room air  Abdominal: Soft, NT, ND, bowel sounds + Extremities: no edema, no cyanosis Psych: Normal mood and affect, stable judgement and insight     The results of significant diagnostics from this hospitalization (including imaging, microbiology, ancillary and laboratory) are listed below for reference.     Microbiology: No results found for this or any previous visit (from the past 240 hour(s)).   Labs: BNP (last 3 results) Recent Labs    08/06/22 0648  BNP 733.9*   Basic Metabolic Panel: Recent Labs  Lab 07/31/22 1332 07/31/22 2147 08/01/22 0415 08/06/22 0657 08/07/22 0355  NA 139  --  140 140 141  K 4.5  --  4.3 4.1 3.9  CL 110  --  114* 112* 108  CO2 22  --  23 20* 26  GLUCOSE 91  --  101* 91 91  BUN 12  --  CREATININE 1.08*  --  1.00 1.13* 1.23*  CALCIUM 9.0  --  8.0* 9.0 8.8*  MG  --  1.9  --   --   --   PHOS  --  2.2* 3.8  --   --    Liver Function Tests: Recent Labs  Lab 08/01/22 0415 08/06/22 0657  AST 30 29  ALT 24 51*  ALKPHOS 49 48  BILITOT 0.7 0.7  PROT 5.9* 6.7  ALBUMIN 3.1* 3.6   No results for input(s): "LIPASE", "AMYLASE" in the last 168 hours. No results for input(s): "AMMONIA" in the last 168 hours. CBC: Recent Labs  Lab 07/31/22 1332 08/01/22 0415 08/06/22 0657  WBC 5.8 5.1 5.9  NEUTROABS  --   --  3.6  HGB 15.7* 13.2 14.0  HCT 48.7* 41.8 43.3  MCV 94.9 96.5 93.5  PLT 240 201 221   Cardiac Enzymes: Recent Labs  Lab 07/31/22 2147  CKTOTAL 159   BNP: Invalid input(s): "POCBNP" CBG: No results for input(s): "GLUCAP" in  the last 168 hours. D-Dimer Recent Labs    08/06/22 0825  DDIMER 0.34   Hgb A1c No results for input(s): "HGBA1C" in the last 72 hours. Lipid Profile No results for input(s): "CHOL", "HDL", "LDLCALC", "TRIG", "CHOLHDL", "LDLDIRECT" in the last 72 hours. Thyroid function studies No results for input(s): "TSH", "T4TOTAL", "T3FREE", "THYROIDAB" in the last 72 hours.  Invalid input(s): "FREET3" Anemia work up No results for input(s): "VITAMINB12", "FOLATE", "FERRITIN", "TIBC", "IRON", "RETICCTPCT" in the last 72 hours. Urinalysis No results found for: "COLORURINE", "APPEARANCEUR", "LABSPEC", "PHURINE", "GLUCOSEU", "HGBUR", "BILIRUBINUR", "KETONESUR", "PROTEINUR", "UROBILINOGEN", "NITRITE", "LEUKOCYTESUR" Sepsis Labs Recent Labs  Lab 07/31/22 1332 08/01/22 0415 08/06/22 0657  WBC 5.8 5.1 5.9   Microbiology No results found for this or any previous visit (from the past 240 hour(s)).   Patient was seen and examined on the day of discharge and was found to be in stable condition. Time coordinating discharge: 40 minutes including assessment and coordination of care, as well as examination of the patient.   SIGNED:  Noralee Stain, DO [facility] 08/07/2022, 10:56 AM

## 2022-08-07 NOTE — Progress Notes (Signed)
Patient discharged home.  Discharge instructions explained, patient verbalizes understanding 

## 2022-08-17 ENCOUNTER — Observation Stay (HOSPITAL_COMMUNITY)
Admission: EM | Admit: 2022-08-17 | Discharge: 2022-08-18 | Disposition: A | Discharge status: Home / Self Care | Payer: 59 | Attending: Internal Medicine | Admitting: Internal Medicine

## 2022-08-17 ENCOUNTER — Encounter (HOSPITAL_COMMUNITY): Payer: Self-pay

## 2022-08-17 ENCOUNTER — Telehealth: Payer: Self-pay | Admitting: Nurse Practitioner

## 2022-08-17 ENCOUNTER — Emergency Department (HOSPITAL_COMMUNITY): Payer: 59

## 2022-08-17 ENCOUNTER — Observation Stay (HOSPITAL_COMMUNITY): Payer: 59

## 2022-08-17 DIAGNOSIS — R778 Other specified abnormalities of plasma proteins: Secondary | ICD-10-CM | POA: Insufficient documentation

## 2022-08-17 DIAGNOSIS — J45909 Unspecified asthma, uncomplicated: Secondary | ICD-10-CM | POA: Diagnosis not present

## 2022-08-17 DIAGNOSIS — Z79899 Other long term (current) drug therapy: Secondary | ICD-10-CM | POA: Diagnosis not present

## 2022-08-17 DIAGNOSIS — I5043 Acute on chronic combined systolic (congestive) and diastolic (congestive) heart failure: Secondary | ICD-10-CM | POA: Diagnosis not present

## 2022-08-17 DIAGNOSIS — J449 Chronic obstructive pulmonary disease, unspecified: Secondary | ICD-10-CM | POA: Diagnosis not present

## 2022-08-17 DIAGNOSIS — R001 Bradycardia, unspecified: Secondary | ICD-10-CM

## 2022-08-17 DIAGNOSIS — R1011 Right upper quadrant pain: Secondary | ICD-10-CM | POA: Diagnosis not present

## 2022-08-17 DIAGNOSIS — I5041 Acute combined systolic (congestive) and diastolic (congestive) heart failure: Secondary | ICD-10-CM

## 2022-08-17 DIAGNOSIS — I509 Heart failure, unspecified: Secondary | ICD-10-CM

## 2022-08-17 DIAGNOSIS — D751 Secondary polycythemia: Secondary | ICD-10-CM | POA: Insufficient documentation

## 2022-08-17 DIAGNOSIS — R0602 Shortness of breath: Secondary | ICD-10-CM | POA: Diagnosis present

## 2022-08-17 LAB — CBC
HCT: 49.6 % — ABNORMAL HIGH (ref 36.0–46.0)
Hemoglobin: 16.2 g/dL — ABNORMAL HIGH (ref 12.0–15.0)
MCH: 30.6 pg (ref 26.0–34.0)
MCHC: 32.7 g/dL (ref 30.0–36.0)
MCV: 93.6 fL (ref 80.0–100.0)
Platelets: 239 10*3/uL (ref 150–400)
RBC: 5.3 MIL/uL — ABNORMAL HIGH (ref 3.87–5.11)
RDW: 14.4 % (ref 11.5–15.5)
WBC: 4.9 10*3/uL (ref 4.0–10.5)
nRBC: 0 % (ref 0.0–0.2)

## 2022-08-17 LAB — COMPREHENSIVE METABOLIC PANEL
ALT: 26 U/L (ref 0–44)
AST: 28 U/L (ref 15–41)
Albumin: 4 g/dL (ref 3.5–5.0)
Alkaline Phosphatase: 51 U/L (ref 38–126)
Anion gap: 8 (ref 5–15)
BUN: 19 mg/dL (ref 6–20)
CO2: 25 mmol/L (ref 22–32)
Calcium: 9.5 mg/dL (ref 8.9–10.3)
Chloride: 106 mmol/L (ref 98–111)
Creatinine, Ser: 1.19 mg/dL — ABNORMAL HIGH (ref 0.44–1.00)
GFR, Estimated: >60 mL/min (ref >60)
Glucose, Bld: 92 mg/dL (ref 70–99)
Potassium: 4.1 mmol/L (ref 3.5–5.1)
Sodium: 139 mmol/L (ref 135–145)
Total Bilirubin: 0.8 mg/dL (ref 0.3–1.2)
Total Protein: 7.3 g/dL (ref 6.5–8.1)

## 2022-08-17 LAB — TROPONIN I (HIGH SENSITIVITY)
Troponin I (High Sensitivity): 31 ng/L — ABNORMAL HIGH (ref <18)
Troponin I (High Sensitivity): 31 ng/L — ABNORMAL HIGH (ref <18)

## 2022-08-17 LAB — BRAIN NATRIURETIC PEPTIDE: B Natriuretic Peptide: 265.5 pg/mL — ABNORMAL HIGH (ref 0.0–100.0)

## 2022-08-17 LAB — MAGNESIUM: Magnesium: 2.1 mg/dL (ref 1.7–2.4)

## 2022-08-17 MED ORDER — HYDROMORPHONE HCL 2 MG/ML IJ SOLN
0.5000 mg | INTRAMUSCULAR | Status: AC
  Administered 2022-08-17: 0.5 mg via INTRAVENOUS
  Filled 2022-08-17: qty 1

## 2022-08-17 MED ORDER — MORPHINE SULFATE (PF) 4 MG/ML IV SOLN
4.0000 mg | Freq: Once | INTRAVENOUS | Status: AC
  Administered 2022-08-17: 4 mg via INTRAVENOUS
  Filled 2022-08-17: qty 1

## 2022-08-17 MED ORDER — ENOXAPARIN SODIUM 40 MG/0.4ML IJ SOSY
40.0000 mg | PREFILLED_SYRINGE | INTRAMUSCULAR | Status: DC
  Administered 2022-08-18: 40 mg via SUBCUTANEOUS
  Filled 2022-08-17: qty 0.4

## 2022-08-17 MED ORDER — FUROSEMIDE 10 MG/ML IJ SOLN
40.0000 mg | Freq: Once | INTRAMUSCULAR | Status: AC
  Administered 2022-08-17: 40 mg via INTRAVENOUS
  Filled 2022-08-17: qty 4

## 2022-08-17 MED ORDER — ASPIRIN 325 MG PO TABS
325.0000 mg | ORAL_TABLET | Freq: Once | ORAL | Status: AC
  Administered 2022-08-17: 325 mg via ORAL
  Filled 2022-08-17: qty 1

## 2022-08-17 NOTE — H&P (Incomplete)
History and Physical  Michelle Strickland FBP:[id number] DOB: 1986-11-30 DOA: 08/17/2022  Referring physician: Dr. Silverio Lay, EDP PCP: Pcp, No  Outpatient Specialists: Cardiology Patient coming from: Home.  Chief Complaint: Shortness of breath, right upper quadrant abdominal pain.  HPI: Michelle Strickland is a 36 y.o. adult with medical history significant for combined diastolic and systolic CHF, PTSD, psychoaffective disorder, bipolar type, who presented to [facility] ED from home with complaints of shortness of breath and right upper quadrant abdominal pain.  Associated with orthopnea, weight gain and low urine output.  Reports compliance with her home medications.    In the ED, work-up revealed mild volume overload.  Due to severe right upper quadrant abdominal pain, an abdominal ultrasound was obtained which was unrevealing.  D-dimer was negative.  The patient received IV diuretics in the ED, 1 dose of aspirin 325 mg x 1, and IV analgesics.  [facility], hospitalist service, was asked to admit.  ED Course: Tmax 98.7.  BP 116/81, pulse 58, respiratory rate 18, O2 saturation 95% on room air.  Lab studies remarkable for WBC 4.9, hemoglobin 16.2, hematocrit 49.6.  Creatinine 1.19, GFR greater than 60.  Troponin 31, repeat 31.  BNP 265 from 733, 10 days ago.  Review of Systems: Review of systems as noted in the HPI. All other systems reviewed and are negative.   Past Medical History:  Diagnosis Date   Asthma    Bronchitis    Chronic diarrhea    COPD (chronic obstructive pulmonary disease) (HCC)    Depression    Environmental and seasonal allergies    Family history of adverse reaction to anesthesia    " My sister vomited and aspirated during surgery; they gave her too much anesthesia"   Fatty liver    H/O heartburn    Morbid obesity (HCC)    Osteoarthritis of right acromioclavicular joint    PTSD (post-traumatic stress disorder)    Schizophrenia (HCC)    Tendinosis of rotator cuff    impingement    Wears glasses    Past Surgical History:  Procedure Laterality Date   CHOLECYSTECTOMY     COLONOSCOPY     SHOULDER ARTHROSCOPY Right 01/31/2019   Procedure: RIGHT SHOULDER ARTHROSCOPIC DEBRIDEMENT ROTATOR CUFF; SUBACROMIAL DECOMPRESSION; DISTAL CLAVICLE EXCISION;  Surgeon: Jones Broom, MD;  Location: [facility] OR;  Service: Orthopedics;  Laterality: Right;   WISDOM TOOTH EXTRACTION      Social History:  reports that he has never smoked. He has never used smokeless tobacco. He reports current alcohol use. He reports that he does not currently use drugs.   Allergies  Allergen Reactions   Contrast Media [Iodinated Contrast Media] Nausea And Vomiting   Penicillins Hives, Itching and Other (See Comments)    Did it involve swelling of the face/tongue/throat, SOB, or low BP? No Did it involve sudden or severe rash/hives, skin peeling, or any reaction on the inside of your mouth or nose? Yes Did you need to seek medical attention at a hospital or doctor's office? Yes When did it last happen?      Childhood allergy If all above answers are "NO", may proceed with cephalosporin use.    Toradol [Ketorolac Tromethamine] Nausea Only and Other (See Comments)    migraines   Tramadol Other (See Comments)    migraines   Nsaids    Watermelon [Citrullus Vulgaris] Other (See Comments)    Throat itches    Family History  Problem Relation Age of Onset   Heart disease Father  Heart disease Paternal Grandfather       Prior to Admission medications   Medication Sig Start Date End Date Taking? Authorizing Provider  diclofenac Sodium (VOLTAREN) 1 % GEL Apply 2 g topically 4 (four) times daily. Patient taking differently: Apply 2 g topically 4 (four) times daily as needed (pain). 07/24/22   Pricilla Loveless, MD  furosemide (LASIX) 40 MG tablet Take 1 tablet (40 mg total) by mouth daily. 08/07/22 09/06/22  Noralee Stain, DO  HYDROcodone-acetaminophen (NORCO/VICODIN) 5-325 MG tablet Take 1 tablet by mouth  every 6 (six) hours as needed for severe pain. 08/02/22   Osvaldo Shipper, MD  lamoTRIgine (LAMICTAL) 25 MG tablet Take 25 mg by mouth daily. 04/25/22   [provider]  losartan (COZAAR) 25 MG tablet Take 0.5 tablets (12.5 mg total) by mouth daily. 08/03/22   Osvaldo Shipper, MD  metoprolol succinate (TOPROL-XL) 25 MG 24 hr tablet Take 0.5 tablets (12.5 mg total) by mouth daily. 08/03/22   Osvaldo Shipper, MD  ondansetron (ZOFRAN-ODT) 8 MG disintegrating tablet Take 8 mg by mouth every 8 (eight) hours as needed for nausea or vomiting. 06/22/22   [provider]  pantoprazole (PROTONIX) 40 MG tablet Take 1 tablet (40 mg total) by mouth daily for 14 days. 08/02/22 08/16/22  Osvaldo Shipper, MD  promethazine (PHENERGAN) 25 MG tablet Take 25 mg by mouth every 6 (six) hours as needed for vomiting or nausea. 06/23/22   [provider]  QUEtiapine (SEROQUEL) 25 MG tablet Take 25 mg by mouth at bedtime. 05/17/22   [provider]  testosterone enanthate (DELATESTRYL) 200 MG/ML injection Inject 200 mg into the muscle every 7 (seven) days. For IM use only    [provider]  thiamine (VITAMIN B1) 100 MG tablet Take 1 tablet (100 mg total) by mouth daily. 08/02/22   Osvaldo Shipper, MD  venlafaxine XR (EFFEXOR-XR) 37.5 MG 24 hr capsule Take 37.5 mg by mouth every morning. 06/09/22   [provider]    Physical Exam: BP (!) 130/104   Pulse 62   Temp 98.7 F (37.1 C)   Resp (!) 22   SpO2 98%   General: 36 y.o. year-old adult well developed well nourished in no acute distress.  Alert and oriented x3. Cardiovascular: Regular rate and rhythm with no rubs or gallops.  No thyromegaly or JVD noted.  No lower extremity edema. 2/4 pulses in all 4 extremities. Respiratory: Faint rales at bases with no wheezing noted. Good inspiratory effort. Abdomen: Soft right upper quadrant tenderness with palpation, nondistended with normal bowel sounds x4 quadrants. Muskuloskeletal: No  cyanosis, clubbing or edema noted bilaterally Neuro: CN II-XII intact, strength, sensation, reflexes Skin: No ulcerative lesions noted or rashes Psychiatry: Judgement and insight appear normal. Mood is appropriate for condition and setting          Labs on Admission:  Basic Metabolic Panel: Recent Labs  Lab 08/17/22 1555  NA 139  K 4.1  CL 106  CO2 25  GLUCOSE 92  BUN 19  CREATININE 1.19*  CALCIUM 9.5  MG 2.1   Liver Function Tests: Recent Labs  Lab 08/17/22 1555  AST 28  ALT 26  ALKPHOS 51  BILITOT 0.8  PROT 7.3  ALBUMIN 4.0   No results for input(s): "LIPASE", "AMYLASE" in the last 168 hours. No results for input(s): "AMMONIA" in the last 168 hours. CBC: Recent Labs  Lab 08/17/22 1555  WBC 4.9  HGB 16.2*  HCT 49.6*  MCV 93.6  PLT  239   Cardiac Enzymes: No results for input(s): "CKTOTAL", "CKMB", "CKMBINDEX", "TROPONINI" in the last 168 hours.  BNP (last 3 results) Recent Labs    08/06/22 0648 08/17/22 1555  BNP 733.9* 265.5*    ProBNP (last 3 results) No results for input(s): "PROBNP" in the last 8760 hours.  CBG: No results for input(s): "GLUCAP" in the last 168 hours.  Radiological Exams on Admission: DG Chest 2 View  Result Date: 08/17/2022 CLINICAL DATA:  Provided history: Evaluate for pulmonary edema. Additional history provided: Shortness of breath, worse when lying down. Recent admission for CHF exacerbation. History of asthma and COPD. EXAM: CHEST - 2 VIEW COMPARISON:  Prior chest radiographs 08/06/2022 and earlier. FINDINGS: Heart size within normal limits. No appreciable airspace consolidation or pulmonary edema. No evidence of pleural effusion or pneumothorax. No acute bony abnormality identified. Surgical clips within the upper abdomen. IMPRESSION: No evidence of acute cardiopulmonary abnormality. Electronically Signed   By: Jackey Loge D.O.   On: 08/17/2022 16:33    EKG: I independently viewed the EKG done and my findings are as  followed: Sinus bradycardia rate of 58.  Nonspecific ST-T changes.  QTc 403.  Assessment/Plan Present on Admission: **None**  Principal Problem:   Acute CHF (congestive heart failure) (HCC)  Acute on chronic combined diastolic and systolic CHF BNP mildly elevated, 265 from 733, 10 days ago. Received IV diuretics in the ED Resume home diuretics Start strict I's and O's and daily weight Recent 2D echo on 08/01/2022 showing LVEF 45% and grade 2 diastolic dysfunction.  Elevated troponin, suspect demand ischemia in the setting of acute CHF Presented with high-sensitivity troponin of 31 D-dimer negative No evidence of acute ischemia on 12-lead EKG Recent 2D echo Received full dose aspirin 325 mg x 1 Trend troponins and consult cardiology in the morning  Right upper quadrant pain LFTs and lipase normal Abdominal ultrasound unrevealing, post cholecystectomy. Analgesics as needed  Sinus bradycardia Last TSH 2.306 on 07/31/2022 In the ED heart rate dropped in the 30s with ambulation. Hold off home beta-blocker tonight. Consult cardiology in the morning  GERD Resume home Protonix  PTSD/psychoaffective disorder, bipolar type Resume home regimen  Acquired erythrocytosis Likely secondary to testosterone injections     DVT prophylaxis: Subcu Lovenox daily  Code Status: Full code  Family Communication: Updated wife at bedside.  Disposition Plan: Admitted to telemetry unit.  Consults called: None.  Admission status: Observation status.   Status is: Observation    Darlin Drop MD [facility] Pager [phone]  If 7PM-7AM, please contact night-coverage [email or web address] Password [facility]  08/17/2022, 10:30 PM

## 2022-08-17 NOTE — ED Notes (Signed)
Pt ambulated in the room. O2 stayed in the upper 90s(97), Pulse dropped to low 30's(33).

## 2022-08-17 NOTE — ED Provider Notes (Signed)
[facility]-EMERGENCY DEPT Provider Note   CSN: [id number] Arrival date & time: 08/17/22  1529     History  Chief Complaint  Patient presents with   Shortness of Breath    Michelle Strickland is a 36 y.o. adult history of heart failure with EF of 45% here presenting with shortness of breath and chest pain.  Patient has been having chest pain for the last month or so.  Patient was admitted twice this month for heart failure.  Patient was most recently admitted 10 days ago.  Patient states that he is compliant with his Lasix 40 mg.  He states that he has not been urinating as much as he usually has.  He states that he has some subjective orthopnea and chest pain.  He noticed that his weight is increased and states that he weighed 190 pounds 2 days ago.  The history is provided by the patient.       Home Medications Prior to Admission medications   Medication Sig Start Date End Date Taking? Authorizing Provider  diclofenac Sodium (VOLTAREN) 1 % GEL Apply 2 g topically 4 (four) times daily. Patient taking differently: Apply 2 g topically 4 (four) times daily as needed (pain). 07/24/22   Pricilla Loveless, MD  furosemide (LASIX) 40 MG tablet Take 1 tablet (40 mg total) by mouth daily. 08/07/22 09/06/22  Noralee Stain, DO  HYDROcodone-acetaminophen (NORCO/VICODIN) 5-325 MG tablet Take 1 tablet by mouth every 6 (six) hours as needed for severe pain. 08/02/22   Osvaldo Shipper, MD  lamoTRIgine (LAMICTAL) 25 MG tablet Take 25 mg by mouth daily. 04/25/22   [provider]  losartan (COZAAR) 25 MG tablet Take 0.5 tablets (12.5 mg total) by mouth daily. 08/03/22   Osvaldo Shipper, MD  metoprolol succinate (TOPROL-XL) 25 MG 24 hr tablet Take 0.5 tablets (12.5 mg total) by mouth daily. 08/03/22   Osvaldo Shipper, MD  ondansetron (ZOFRAN-ODT) 8 MG disintegrating tablet Take 8 mg by mouth every 8 (eight) hours as needed for nausea or vomiting. 06/22/22   [provider]   pantoprazole (PROTONIX) 40 MG tablet Take 1 tablet (40 mg total) by mouth daily for 14 days. 08/02/22 08/16/22  Osvaldo Shipper, MD  promethazine (PHENERGAN) 25 MG tablet Take 25 mg by mouth every 6 (six) hours as needed for vomiting or nausea. 06/23/22   [provider]  QUEtiapine (SEROQUEL) 25 MG tablet Take 25 mg by mouth at bedtime. 05/17/22   [provider]  testosterone enanthate (DELATESTRYL) 200 MG/ML injection Inject 200 mg into the muscle every 7 (seven) days. For IM use only    [provider]  thiamine (VITAMIN B1) 100 MG tablet Take 1 tablet (100 mg total) by mouth daily. 08/02/22   Osvaldo Shipper, MD  venlafaxine XR (EFFEXOR-XR) 37.5 MG 24 hr capsule Take 37.5 mg by mouth every morning. 06/09/22   [provider]      Allergies    Contrast media [iodinated contrast media], Penicillins, Toradol [ketorolac tromethamine], Tramadol, Nsaids, and Watermelon [citrullus vulgaris]    Review of Systems   Review of Systems  Respiratory:  Positive for shortness of breath.   All other systems reviewed and are negative.   Physical Exam Updated Vital Signs BP (!) 130/104   Pulse 62   Temp 98.7 F (37.1 C)   Resp (!) 22   SpO2 98%  Physical Exam Vitals and nursing note reviewed.  Constitutional:      Comments: Slightly tachypneic  HENT:     Head: Normocephalic.     Mouth/Throat:     Mouth: Mucous membranes are moist.  Eyes:     Extraocular Movements: Extraocular movements intact.     Pupils: Pupils are equal, round, and reactive to light.  Cardiovascular:     Rate and Rhythm: Normal rate and regular rhythm.  Pulmonary:     Comments: Slightly tachypneic and diminished bilateral bases Musculoskeletal:     Cervical back: Normal range of motion and neck supple.     Comments: 1+ edema bilaterally   Skin:    General: Skin is warm.  Neurological:     General: No focal deficit present.     Mental Status: He is oriented to person, place, and time.   Psychiatric:        Mood and Affect: Mood normal.        Behavior: Behavior normal.     ED Results / Procedures / Treatments   Labs (all labs ordered are listed, but only abnormal results are displayed) Labs Reviewed  CBC - Abnormal; Notable for the following components:      Result Value   RBC 5.30 (*)    Hemoglobin 16.2 (*)    HCT 49.6 (*)    All other components within normal limits  COMPREHENSIVE METABOLIC PANEL - Abnormal; Notable for the following components:   Creatinine, Ser 1.19 (*)    All other components within normal limits  BRAIN NATRIURETIC PEPTIDE - Abnormal; Notable for the following components:   B Natriuretic Peptide 265.5 (*)    All other components within normal limits  TROPONIN I (HIGH SENSITIVITY) - Abnormal; Notable for the following components:   Troponin I (High Sensitivity) 31 (*)    All other components within normal limits  TROPONIN I (HIGH SENSITIVITY) - Abnormal; Notable for the following components:   Troponin I (High Sensitivity) 31 (*)    All other components within normal limits  MAGNESIUM    EKG EKG Interpretation  Date/Time:  Wednesday August 17 2022 15:45:17 EDT Ventricular Rate:  56 PR Interval:  133 QRS Duration: 95 QT Interval:  410 QTC Calculation: 396 R Axis:   129 Text Interpretation: Sinus rhythm Consider right atrial enlargement Right axis deviation No significant change since last tracing Confirmed by Richardean Canal [phone]) on 08/17/2022 10:09:37 PM  EKG Interpretation  Date/Time:  Wednesday August 17 2022 15:45:17 EDT Ventricular Rate:  56 PR Interval:  133 QRS Duration: 95 QT Interval:  410 QTC Calculation: 396 R Axis:   129 Text Interpretation: Sinus rhythm Consider right atrial enlargement Right axis deviation No significant change since last tracing Confirmed by Richardean Canal [phone]) on 08/17/2022 10:09:37 PM   Radiology DG Chest 2 View  Result Date: 08/17/2022 CLINICAL DATA:  Provided history: Evaluate for  pulmonary edema. Additional history provided: Shortness of breath, worse when lying down. Recent admission for CHF exacerbation. History of asthma and COPD. EXAM: CHEST - 2 VIEW COMPARISON:  Prior chest radiographs 08/06/2022 and earlier. FINDINGS: Heart size within normal limits. No appreciable airspace consolidation or pulmonary edema. No evidence of pleural effusion or pneumothorax. No acute bony abnormality identified. Surgical clips within the upper abdomen. IMPRESSION: No evidence of acute cardiopulmonary abnormality. Electronically Signed   By: Jackey Loge D.O.   On: 08/17/2022 16:33    Procedures Procedures    Medications Ordered in ED Medications  morphine (PF) 4 MG/ML injection 4 mg (has no administration in time range)  aspirin tablet 325 mg (  has no administration in time range)  furosemide (LASIX) injection 40 mg (40 mg Intravenous Given 08/17/22 2106)  morphine (PF) 4 MG/ML injection 4 mg (4 mg Intravenous Given 08/17/22 2106)    ED Course/ Medical Decision Making/ A&P                           Medical Decision Making SUJEY GUNDRY is a 36 y.o. adult here presenting with shortness of breath.  Patient has history of heart failure with a EF of 40%.  Patient is compliant with his Lasix. Consider heart failure exacerbation and less likely ACS.  Plan to get CBC and BMP and troponin x2 and BNP.  We will give IV Lasix and reassess.  10:09 PM Chest x-ray is clear and BNP improved to 270.  Patient states that he did gain some weight so clinically he is following overloaded.  He just had a negative D-dimer several days ago.  Of note, when the nurse tried to ambulate patient, his oxygen remains normal but his heart rate dropped down to the 30s.  I wonder if he has symptomatic bradycardia.  At this point, patient will need to be admitted for telemetry monitoring.  Patient does not appear to have heart block on EKG   Problems Addressed: Heart failure, unspecified HF chronicity,  unspecified heart failure type [facility]): acute illness or injury Symptomatic bradycardia: acute illness or injury  Amount and/or Complexity of Data Reviewed Labs: ordered. Decision-making details documented in ED Course. Radiology: ordered and independent interpretation performed. Decision-making details documented in ED Course. ECG/medicine tests: ordered.  Risk OTC drugs. Prescription drug management. Decision regarding hospitalization.    Final Clinical Impression(s) / ED Diagnoses Final diagnoses:  None    Rx / DC Orders ED Discharge Orders     None         Charlynne Pander, MD 08/17/22 2228

## 2022-08-17 NOTE — Hospital Course (Signed)
Pt called our office and this writer spoke with him. He has been having increased SOB primarily DOE and orthopnea x 1 week. No edema and possible weight gain. He was prescribed Lasix 40 mg upon dc and due to his symptoms he decided to double up on that dose. Since that time his symptoms have not improved and his UOP has decreased. Denies dizziness. Does not have a way tp measure his BP. States he has OP cariology FU in about one month. Has not yet established with [facility]. Instructed to first try [facility] see if they will go ahead and see him. If they are unable to evaluate him then he has been instructed to go to urgent care for evaluation and labs. Mr Ringley verbalized understanding.

## 2022-08-17 NOTE — ED Triage Notes (Signed)
Pt arrived via POV, c/o SOB, worsening while laying down. Recently admitted for CHF exacerbation.

## 2022-08-17 NOTE — Telephone Encounter (Signed)
Per Haroldine Laws with [facility]  Pt called our office and this writer spoke with him. He has been having increased SOB primarily DOE and orthopnea x 1 week. No edema and possible weight gain. He was prescribed Lasix 40 mg upon dc and due to his symptoms he decided to double up on that dose. Since that time his symptoms have not improved and his UOP has decreased. Denies dizziness. Does not have a way tp measure his BP. States he has OP cariology FU in about one month. Has not yet established with [facility]. Instructed to first try [facility] see if they will go ahead and see him. If they are unable to evaluate him then he has been instructed to go to urgent care for evaluation and labs. Mr Stange verbalized understanding.

## 2022-08-17 NOTE — ED Provider Triage Note (Signed)
Emergency Medicine Provider Triage Evaluation Note  Michelle Strickland , a 36 y.o. adult  was evaluated in triage.  Pt complains of shortness of breath that is progressively worsening over the past 4 days.  Chest pain over the past 2 days.  Recently discharged for acute CHF.  Endorses orthopnea.   Review of Systems  Positive: As above  Negative: As above  Physical Exam  There were no vitals taken for this visit. Gen:   Awake, no distress   Resp:  Normal effort  MSK:   Moves extremities without difficulty Other:    Medical Decision Making  Medically screening exam initiated at 3:47 PM.  Appropriate orders placed.  Michelle Strickland was informed that the remainder of the evaluation will be completed by another provider, this initial triage assessment does not replace that evaluation, and the importance of remaining in the ED until their evaluation is complete.     Marita Kansas, PA-C 08/17/22 [phone]

## 2022-08-17 NOTE — Treatment Plan (Deleted)
Pt called our office and this writer spoke with him. He has been having increased SOB primarily DOE and orthopnea x 1 week. No edema and possible weight gain. He was prescribed Lasix 40 mg upon dc and due to his symptoms he decided to double up on that dose. Since that time his symptoms have not improved and his UOP has decreased. Denies dizziness. Does not have a way tp measure his BP. States he has OP cariology FU in about one month. Has not yet established with [facility]. Instructed to first try [facility] see if they will go ahead and see him. If they are unable to evaluate him then he has been instructed to go to urgent care for evaluation and labs. Michelle Strickland verbalized understanding. 

## 2022-08-17 NOTE — Significant Event (Deleted)
Pt called our office and this writer spoke with him. He has been having increased SOB primarily DOE and orthopnea x 1 week. No edema and possible weight gain. He was prescribed Lasix 40 mg upon dc and due to his symptoms he decided to double up on that dose. Since that time his symptoms have not improved and his UOP has decreased. Denies dizziness. Does not have a way tp measure his BP. States he has OP cariology FU in about one month. Has not yet established with [facility]. Instructed to first try [facility] see if they will go ahead and see him. If they are unable to evaluate him then he has been instructed to go to urgent care for evaluation and labs. Mr Riojas verbalized understanding. 

## 2022-08-18 DIAGNOSIS — I5041 Acute combined systolic (congestive) and diastolic (congestive) heart failure: Secondary | ICD-10-CM | POA: Diagnosis not present

## 2022-08-18 LAB — COMPREHENSIVE METABOLIC PANEL
ALT: 62 U/L — ABNORMAL HIGH (ref 0–44)
AST: 78 U/L — ABNORMAL HIGH (ref 15–41)
Albumin: 4 g/dL (ref 3.5–5.0)
Alkaline Phosphatase: 62 U/L (ref 38–126)
Anion gap: 9 (ref 5–15)
BUN: 18 mg/dL (ref 6–20)
CO2: 24 mmol/L (ref 22–32)
Calcium: 9.2 mg/dL (ref 8.9–10.3)
Chloride: 104 mmol/L (ref 98–111)
Creatinine, Ser: 1.09 mg/dL — ABNORMAL HIGH (ref 0.44–1.00)
GFR, Estimated: >60 mL/min (ref >60)
Glucose, Bld: 128 mg/dL — ABNORMAL HIGH (ref 70–99)
Potassium: 4.1 mmol/L (ref 3.5–5.1)
Sodium: 137 mmol/L (ref 135–145)
Total Bilirubin: 1 mg/dL (ref 0.3–1.2)
Total Protein: 7 g/dL (ref 6.5–8.1)

## 2022-08-18 LAB — CBC
HCT: 48.3 % — ABNORMAL HIGH (ref 36.0–46.0)
HCT: 48.6 % — ABNORMAL HIGH (ref 36.0–46.0)
Hemoglobin: 15.6 g/dL — ABNORMAL HIGH (ref 12.0–15.0)
Hemoglobin: 15.6 g/dL — ABNORMAL HIGH (ref 12.0–15.0)
MCH: 30.1 pg (ref 26.0–34.0)
MCH: 30.5 pg (ref 26.0–34.0)
MCHC: 32.1 g/dL (ref 30.0–36.0)
MCHC: 32.3 g/dL (ref 30.0–36.0)
MCV: 93.8 fL (ref 80.0–100.0)
MCV: 94.5 fL (ref 80.0–100.0)
Platelets: 208 10*3/uL (ref 150–400)
Platelets: 210 10*3/uL (ref 150–400)
RBC: 5.11 MIL/uL (ref 3.87–5.11)
RBC: 5.18 MIL/uL — ABNORMAL HIGH (ref 3.87–5.11)
RDW: 14.3 % (ref 11.5–15.5)
RDW: 14.4 % (ref 11.5–15.5)
WBC: 4.7 10*3/uL (ref 4.0–10.5)
WBC: 7.6 10*3/uL (ref 4.0–10.5)
nRBC: 0 % (ref 0.0–0.2)
nRBC: 0 % (ref 0.0–0.2)

## 2022-08-18 LAB — CREATININE, SERUM
Creatinine, Ser: 1.2 mg/dL — ABNORMAL HIGH (ref 0.44–1.00)
GFR, Estimated: >60 mL/min (ref >60)

## 2022-08-18 LAB — LIPASE, BLOOD: Lipase: 25 U/L (ref 11–51)

## 2022-08-18 LAB — TROPONIN I (HIGH SENSITIVITY)
Troponin I (High Sensitivity): 25 ng/L — ABNORMAL HIGH (ref <18)
Troponin I (High Sensitivity): 30 ng/L — ABNORMAL HIGH (ref <18)

## 2022-08-18 LAB — MAGNESIUM: Magnesium: 2.1 mg/dL (ref 1.7–2.4)

## 2022-08-18 LAB — D-DIMER, QUANTITATIVE: D-Dimer, Quant: <0.27 ug/mL-FEU (ref 0.00–0.50)

## 2022-08-18 MED ORDER — PROCHLORPERAZINE EDISYLATE 10 MG/2ML IJ SOLN
10.0000 mg | Freq: Four times a day (QID) | INTRAMUSCULAR | Status: DC | PRN
Start: 2022-08-18 — End: 2022-08-18
  Administered 2022-08-18: 10 mg via INTRAVENOUS
  Filled 2022-08-18: qty 2

## 2022-08-18 MED ORDER — HYDROCODONE-ACETAMINOPHEN 5-325 MG PO TABS: 1.0000 | ORAL_TABLET | Freq: Four times a day (QID) | ORAL | 0 refills | Status: DC | PRN

## 2022-08-18 MED ORDER — VENLAFAXINE HCL ER 37.5 MG PO CP24
37.5000 mg | ORAL_CAPSULE | Freq: Every morning | ORAL | Status: DC
  Administered 2022-08-18: 37.5 mg via ORAL
  Filled 2022-08-18: qty 1

## 2022-08-18 MED ORDER — OXYCODONE HCL 5 MG PO TABS
5.0000 mg | ORAL_TABLET | Freq: Four times a day (QID) | ORAL | Status: DC | PRN
  Administered 2022-08-18: 5 mg via ORAL
  Filled 2022-08-18: qty 1

## 2022-08-18 MED ORDER — FUROSEMIDE 40 MG PO TABS
40.0000 mg | ORAL_TABLET | Freq: Every day | ORAL | Status: DC
  Administered 2022-08-18: 40 mg via ORAL
  Filled 2022-08-18: qty 1

## 2022-08-18 MED ORDER — TORSEMIDE 20 MG PO TABS: 20.0000 mg | ORAL_TABLET | Freq: Every day | ORAL | 0 refills | Status: DC

## 2022-08-18 MED ORDER — HYDROMORPHONE HCL 2 MG/ML IJ SOLN: 0.5000 mg | INTRAMUSCULAR | Status: DC | PRN

## 2022-08-18 MED ORDER — PANTOPRAZOLE SODIUM 40 MG PO TBEC
40.0000 mg | DELAYED_RELEASE_TABLET | Freq: Every day | ORAL | Status: DC
  Administered 2022-08-18: 40 mg via ORAL
  Filled 2022-08-18: qty 1

## 2022-08-18 MED ORDER — POLYETHYLENE GLYCOL 3350 17 G PO PACK
17.0000 g | PACK | Freq: Every day | ORAL | Status: DC
  Filled 2022-08-18: qty 1

## 2022-08-18 MED ORDER — THIAMINE HCL 100 MG PO TABS
100.0000 mg | ORAL_TABLET | Freq: Every day | ORAL | Status: DC
  Administered 2022-08-18: 100 mg via ORAL
  Filled 2022-08-18: qty 1

## 2022-08-18 MED ORDER — LAMOTRIGINE 25 MG PO TABS
25.0000 mg | ORAL_TABLET | Freq: Every day | ORAL | Status: DC
  Administered 2022-08-18: 25 mg via ORAL
  Filled 2022-08-18: qty 1

## 2022-08-18 NOTE — ED Notes (Signed)
Pt verbalized understanding of discharge paperwork, follow-up care and prescriptions.  

## 2022-08-18 NOTE — ED Notes (Signed)
Patient c/o nausea.  Medicated per PRN order

## 2022-08-18 NOTE — Discharge Summary (Signed)
Physician Discharge Summary  Michelle Strickland QGB:[id number] DOB: 11/09/1986 DOA: 08/17/2022  PCP: System, Provider Not In  Admit date: 08/17/2022 Discharge date: 08/18/2022  Admitted From: Home Disposition: Home  Recommendations for Outpatient Follow-up:  Follow up with PCP in 1-2 weeks Follow-up with the cardiology clinic as scheduled.  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  Currently on Lasix at home and did not get relief of symptoms despite using double dose of Lasix.  Complained of bilateral subcostal pain that is worse with breathing.  In the emergency room afebrile.  Blood pressure stable.  On room air.  Creatinine 1.19.  Troponin 31.  BNP 265 and that was 733, 10 days ago.  She was recently admitted to the hospital and discharged on diuretics.  Right upper quadrant ultrasound was normal.  Chest x-ray was without any evidence of fluid overload.  Acute on chronic combined systolic and diastolic heart failure: Fluid retention that is multifactorial. Ineffective dose of diuresis. Noncompliance to salt and fluid intake. Concomitant use of high-dose testosterone.  Symptoms improved after 1 dose of IV Lasix and started on oral Lasix. Recent 2D echocardiogram on 8/7 showing ejection fraction 45% and grade 2 diastolic dysfunction. Borderline elevated troponins without evidence of ischemia. Right upper quadrant pain is nonlocalized, negative imaging studies.  Plan: Adequate symptom control. Replace furosemide 40 mg daily to torsemide 20 mg daily to achieve better diuresis.  Patient reported better diuresis with torsemide in  the past. Dietary compliance.  Salt and fluid restrictions. Patient on losartan and tolerating well, she will continue. Reported sinus bradycardia of 33 while in the emergency room, will discontinue metoprolol for now. Testosterone may be playing a major role on fluid retention, she is going to talk to her prescriber about testosterone. Short course of Tylenol and Norco was prescribed to use for severe pain.    Discharge Diagnoses:  Principal Problem:   Acute CHF (congestive heart failure) [facility])    Discharge Instructions  Discharge Instructions     AMB referral to CHF clinic   Complete by: As directed    Diet - low sodium heart healthy   Complete by: As directed    Increase activity slowly   Complete by: As directed       Allergies as of 08/18/2022       Reactions   Contrast Media [iodinated Contrast Media] Nausea And Vomiting   Penicillins Hives, Itching, Other (See Comments)   Did it involve swelling of the face/tongue/throat, SOB, or low BP? No Did it involve sudden or severe rash/hives, skin peeling, or any reaction on the inside of your mouth or nose? Yes Did you need to seek medical attention at a hospital or doctor's office? Yes When did it last happen?      Childhood allergy If all above answers are "NO", may proceed with cephalosporin use.   Toradol [ketorolac Tromethamine] Nausea Only, Other (See Comments)   migraines   Tramadol Other (See Comments)   migraines   Nsaids    Watermelon [citrullus Vulgaris] Other (See Comments)   Throat itches        Medication List     STOP taking these medications    furosemide  40 MG tablet Commonly known as: Lasix   metoprolol succinate 25 MG 24 hr tablet Commonly known as: TOPROL-XL   ondansetron 8 MG disintegrating tablet Commonly known as: ZOFRAN-ODT   pantoprazole 40 MG tablet Commonly known as: PROTONIX   promethazine 25 MG tablet Commonly known as: PHENERGAN       TAKE these medications     diclofenac Sodium 1 % Gel Commonly known as: Voltaren Apply 2 g topically 4 (four) times daily. What changed:  when to take this reasons to take this   HYDROcodone-acetaminophen 5-325 MG tablet Commonly known as: NORCO/VICODIN Take 1 tablet by mouth every 6 (six) hours as needed for severe pain.   lamoTRIgine 25 MG tablet Commonly known as: LAMICTAL Take 25 mg by mouth daily.   losartan 25 MG tablet Commonly known as: COZAAR Take 0.5 tablets (12.5 mg total) by mouth daily.   QUEtiapine 25 MG tablet Commonly known as: SEROQUEL Take 25 mg by mouth at bedtime.   testosterone enanthate 200 MG/ML injection Commonly known as: DELATESTRYL Inject 200 mg into the muscle every 7 (seven) days. For IM use only   thiamine 100 MG tablet Commonly known as: VITAMIN B1 Take 1 tablet (100 mg total) by mouth daily.   torsemide 20 MG tablet Commonly known as: DEMADEX Take 1 tablet (20 mg total) by mouth daily.   venlafaxine XR 37.5 MG 24 hr capsule Commonly known as: EFFEXOR-XR Take 37.5 mg by mouth every morning.        Allergies  Allergen Reactions   Contrast Media [Iodinated Contrast Media] Nausea And Vomiting   Penicillins Hives, Itching and Other (See Comments)    Did it involve swelling of the face/tongue/throat, SOB, or low BP? No Did it involve sudden or severe rash/hives, skin peeling, or any reaction on the inside of your mouth or nose? Yes Did you need to seek medical attention at a hospital or doctor's office? Yes When did it last happen?      Childhood allergy If all above answers are "NO", may proceed with cephalosporin use.    Toradol [Ketorolac Tromethamine] Nausea Only and Other (See Comments)    migraines   Tramadol Other (See Comments)    migraines   Nsaids    Watermelon [Citrullus Vulgaris] Other (See Comments)    Throat itches    Consultations: None   Procedures/Studies: US Abdomen Complete  Result Date: 08/18/2022 CLINICAL DATA:  Abdominal  pain.  History of cholecystectomy. EXAM: ABDOMEN ULTRASOUND COMPLETE COMPARISON:  None Available. FINDINGS: Evaluation is limited due to body habitus and overlying bowel gas. Gallbladder: Cholecystectomy. Common bile duct: Diameter: 7 mm Liver: No focal lesion identified. Within normal limits in parenchymal echogenicity. Portal vein is patent on color Doppler imaging with normal direction of blood flow towards the liver. IVC: No abnormality visualized. Pancreas: The pancreas is poorly visualized. Spleen: Size and appearance within normal limits. Right Kidney: Length: 11.1 cm. Echogenicity within normal limits. No mass or hydronephrosis visualized. Left Kidney: Length: 10.0 cm. Echogenicity within normal limits. No mass or hydronephrosis visualized. Abdominal aorta: No aneurysm visualized. Other findings: None. IMPRESSION: Cholecystectomy, otherwise unremarkable abdominal ultrasound. Electronically Signed   By: Elgie Collard M.D.   On: 08/18/2022 00:08   DG Chest 2 View  Result Date: 08/17/2022 CLINICAL DATA:  Provided history: Evaluate for pulmonary edema. Additional history provided: Shortness of breath, worse when lying down. Recent admission for CHF exacerbation. History of asthma and COPD. EXAM: CHEST - 2 VIEW COMPARISON:  Prior chest  radiographs 08/06/2022 and earlier. FINDINGS: Heart size within normal limits. No appreciable airspace consolidation or pulmonary edema. No evidence of pleural effusion or pneumothorax. No acute bony abnormality identified. Surgical clips within the upper abdomen. IMPRESSION: No evidence of acute cardiopulmonary abnormality. Electronically Signed   By: Jackey Loge D.O.   On: 08/17/2022 16:33   DG Chest 2 View  Result Date: 08/06/2022 CLINICAL DATA:  36 year old female with history of worsening shortness of breath. EXAM: CHEST - 2 VIEW COMPARISON:  Chest x-ray 07/31/2022. FINDINGS: Lung volumes are normal. No consolidative airspace disease. No pleural effusions. No  pneumothorax. Cephalization of the pulmonary vasculature, without frank pulmonary edema. Heart size is mildly enlarged. Upper mediastinal contours are within normal limits. IMPRESSION: 1. Cardiomegaly with pulmonary venous congestion, but no frank pulmonary edema. Electronically Signed   By: Trudie Reed M.D.   On: 08/06/2022 07:17   CT CORONARY MORPH W/CTA COR W/SCORE W/CA W/CM &/OR WO/CM  Addendum Date: 08/02/2022   ADDENDUM REPORT: 08/02/2022 14:40 CLINICAL DATA:  This over-read does not include interpretation of cardiac or coronary anatomy or pathology. The coronary CTA interpretation by the cardiologist is attached. COMPARISON:  None available. FINDINGS: No suspicious nodules, masses, or infiltrates are identified in the visualized portion of the lungs. Mild bilateral pleural-parenchymal scarring noted. No pleural fluid seen. The visualized portions of the mediastinum and chest wall are unremarkable. IMPRESSION: No significant non-cardiac abnormality identified. Electronically Signed   By: Danae Orleans M.D.   On: 08/02/2022 14:40   Result Date: 08/02/2022 HISTORY: Chest pain EXAM: Cardiac/Coronary CT TECHNIQUE: The patient was scanned on a [employer]. PROTOCOL: A 130 kV prospective scan was triggered in the descending thoracic aorta at 111 HU's. Axial non-contrast 3 mm slices were carried out through the heart. The data set was analyzed on a dedicated work station and scored using the Agatston method. Gantry rotation speed was 250 msecs and collimation was .6 mm. Heart rate was optimized medically and sl NTG was given. The 3D data set was reconstructed in 5% intervals of the 35-75 % of the R-R cycle. Systolic and diastolic phases were analyzed on a dedicated work station using MPR, MIP and VRT modes. The patient received OMNIPAQUE IOHEXOL 350 MG/ML SOLN of contrast. FINDINGS: Coronary calcium score: The patient's coronary artery calcium score is 0, which places the patient in the 0  percentile. Coronary arteries: Normal coronary origins.  Right dominance. Right Coronary Artery: Normal caliber vessel, gives rise to PDA and PLB. No significant plaque or stenosis. Left Main Coronary Artery: Normal caliber vessel. No significant plaque or stenosis. Left Anterior Descending Coronary Artery: Normal caliber vessel. No significant plaque or stenosis. Gives rise to two small diagonal branches. Left Circumflex Artery: Normal caliber vessel. No significant plaque or stenosis. Gives rise to two small OM branches. Aorta: Normal size, 30 mm at the mid ascending aorta (level of the PA bifurcation) measured double oblique. No aortic atherosclerosis. No dissection seen in visualized portions of the aorta. Aortic Valve: No calcifications. Trileaflet. Other findings: Normal pulmonary vein drainage into the left atrium. Normal left atrial appendage. There is mixing artifact at distal LAA, without delayed imaging cannot evaluate/exclude thrombus. Dilated size of the pulmonary artery, measuring 35 mm. Normal appearance of the pericardium. IMPRESSION: 1. No evidence of CAD, CADRADS = 0. 2. Coronary calcium score of 0. This was 0 percentile for age and sex matched control. 3. Normal coronary origin with right dominance. 4. Dilated size of the pulmonary artery, measuring 35  mm. INTERPRETATION: 1. CAD-RADS 0: No evidence of CAD (0%). Consider non-atherosclerotic causes of chest pain. 2. CAD-RADS 1: Minimal non-obstructive CAD (0-24%). Consider non-atherosclerotic causes of chest pain. Consider preventive therapy and risk factor modification. 3. CAD-RADS 2: Mild non-obstructive CAD (25-49%). Consider non-atherosclerotic causes of chest pain. Consider preventive therapy and risk factor modification. 4. CAD-RADS 3: Moderate stenosis (50-69%). Consider symptom-guided anti-ischemic pharmacotherapy as well as risk factor modification per guideline directed care. Additional analysis with CT FFR will be submitted. 5. CAD-RADS  4: Severe stenosis. (70-99% or > 50% left main). Cardiac catheterization or CT FFR is recommended. Consider symptom-guided anti-ischemic pharmacotherapy as well as risk factor modification per guideline directed care. Invasive coronary angiography recommended with revascularization per published guideline statements. 6. CAD-RADS 5: Total coronary occlusion (100%). Consider cardiac catheterization or viability assessment. Consider symptom-guided anti-ischemic pharmacotherapy as well as risk factor modification per guideline directed care. 7. CAD-RADS N: Non-diagnostic study. Obstructive CAD can't be excluded. Alternative evaluation is recommended. Electronically Signed: By: Jodelle Red M.D. On: 08/02/2022 12:54   ECHOCARDIOGRAM COMPLETE  Result Date: 08/01/2022    ECHOCARDIOGRAM REPORT   Patient Name:   DAVONDA AUSLEY Date of Exam: 08/01/2022 Medical Rec #:  [id number]           Height:       66.0 in Accession #:    [id number]          Weight:       280.0 lb Date of Birth:  03/15/1986           BSA:          2.307 m Patient Age:    36 years            BP:           130/99 mmHg Patient Gender: F                   HR:           67 bpm. Exam Location:  Inpatient Procedure: 2D Echo, 3D Echo, Cardiac Doppler, Color Doppler and Intracardiac            Opacification Agent Indications:    R07.9* Chest pain, unspecified  History:        Patient has no prior history of Echocardiogram examinations.                 Signs/Symptoms:Chest Pain. ETOH.  Sonographer:    Sheralyn Boatman RDCS Referring Phys: [id number] ANASTASSIA DOUTOVA  Sonographer Comments: Technically difficult study due to poor echo windows and patient is morbidly obese. Image acquisition challenging due to patient body habitus. Delay sending study IMPRESSIONS  1. Left ventricular ejection fraction by 3D volume is 45 %. The left ventricle has mildly decreased function. The left ventricle demonstrates global hypokinesis. There is mild left ventricular  hypertrophy. Left ventricular diastolic parameters are consistent with Grade II diastolic dysfunction (pseudonormalization).  2. Right ventricular systolic function is normal. The right ventricular size is normal.  3. Left atrial size was mildly dilated.  4. The mitral valve is normal in structure. Trivial mitral valve regurgitation. No evidence of mitral stenosis.  5. The aortic valve is tricuspid. Aortic valve regurgitation is not visualized. No aortic stenosis is present.  6. The inferior vena cava is dilated in size with <50% respiratory variability, suggesting right atrial pressure of 15 mmHg. FINDINGS  Left Ventricle: Left ventricular ejection fraction by 3D volume is 45 %. The left ventricle has mildly decreased  function. The left ventricle demonstrates global hypokinesis. Definity contrast agent was given IV to delineate the left ventricular endocardial borders. The left ventricular internal cavity size was normal in size. There is mild left ventricular hypertrophy. Left ventricular diastolic parameters are consistent with Grade II diastolic dysfunction (pseudonormalization). Right Ventricle: The right ventricular size is normal. No increase in right ventricular wall thickness. Right ventricular systolic function is normal. Left Atrium: Left atrial size was mildly dilated. Right Atrium: Right atrial size was normal in size. Pericardium: There is no evidence of pericardial effusion. Mitral Valve: The mitral valve is normal in structure. Trivial mitral valve regurgitation. No evidence of mitral valve stenosis. Tricuspid Valve: The tricuspid valve is normal in structure. Tricuspid valve regurgitation is not demonstrated. No evidence of tricuspid stenosis. Aortic Valve: The aortic valve is tricuspid. Aortic valve regurgitation is not visualized. No aortic stenosis is present. Pulmonic Valve: The pulmonic valve was normal in structure. Pulmonic valve regurgitation is not visualized. No evidence of pulmonic  stenosis. Aorta: The aortic root is normal in size and structure. Venous: The inferior vena cava is dilated in size with less than 50% respiratory variability, suggesting right atrial pressure of 15 mmHg. IAS/Shunts: No atrial level shunt detected by color flow Doppler.  LEFT VENTRICLE PLAX 2D LVIDd:         5.20 cm         Diastology LVIDs:         3.60 cm         LV e' medial:    4.43 cm/s LV PW:         1.10 cm         LV E/e' medial:  12.3 LV IVS:        1.20 cm         LV e' lateral:   6.90 cm/s LVOT diam:     1.90 cm         LV E/e' lateral: 7.9 LV SV:         56 LV SV Index:   24 LVOT Area:     2.84 cm        3D Volume EF                                LV 3D EF:    Left                                             ventricul LV Volumes (MOD)                            ar LV vol d, MOD    112.0 ml                   ejection A2C:                                        fraction LV vol d, MOD    90.6 ml                    by 3D A4C:  volume is LV vol s, MOD    51.2 ml                    45 %. A2C: LV vol s, MOD    36.1 ml A4C:                           3D Volume EF: LV SV MOD A2C:   60.8 ml       3D EF:        45 % LV SV MOD A4C:   90.6 ml       LV EDV:       149 ml LV SV MOD BP:    56.5 ml       LV ESV:       83 ml                                LV SV:        66 ml RIGHT VENTRICLE             IVC RV S prime:     12.80 cm/s  IVC diam: 2.70 cm TAPSE (M-mode): 2.0 cm LEFT ATRIUM              Index        RIGHT ATRIUM           Index LA diam:        4.50 cm  1.95 cm/m   RA Area:     17.20 cm LA Vol (A2C):   106.0 ml 45.94 ml/m  RA Volume:   43.60 ml  18.90 ml/m LA Vol (A4C):   58.9 ml  25.53 ml/m LA Biplane Vol: 84.0 ml  36.41 ml/m  AORTIC VALVE LVOT Vmax:   119.00 cm/s LVOT Vmean:  73.700 cm/s LVOT VTI:    0.196 m  AORTA Ao Root diam: 3.30 cm Ao Asc diam:  3.00 cm MITRAL VALVE MV Area (PHT): 3.31 cm    SHUNTS MV Decel Time: 229 msec    Systemic VTI:  0.20 m MV E velocity:  54.50 cm/s  Systemic Diam: 1.90 cm MV A velocity: 36.00 cm/s MV E/A ratio:  1.51 Donato Schultz MD Electronically signed by Donato Schultz MD Signature Date/Time: 08/01/2022/2:51:08 PM    Final    CT HEAD WO CONTRAST ( )  Result Date: 07/31/2022 CLINICAL DATA:  Headache, sudden, severe EXAM: CT HEAD WITHOUT CONTRAST TECHNIQUE: Contiguous axial images were obtained from the base of the skull through the vertex without intravenous contrast. RADIATION DOSE REDUCTION: This exam was performed according to the departmental dose-optimization program which includes automated exposure control, adjustment of the mA and/or kV according to patient size and/or use of iterative reconstruction technique. COMPARISON:  None Available. FINDINGS: Brain: Normal anatomic configuration. No abnormal intra or extra-axial mass lesion or fluid collection. No abnormal mass effect or midline shift. No evidence of acute intracranial hemorrhage or infarct. Ventricular size is normal. Cerebellum unremarkable. Vascular: Unremarkable Skull: Intact Sinuses/Orbits: Paranasal sinuses are clear. Orbits are unremarkable. Other: Mastoid air cells and middle ear cavities are clear. IMPRESSION: No acute intracranial abnormality. Electronically Signed   By: Helyn Numbers M.D.   On: 07/31/2022 23:16   DG Chest 2 View  Result Date: 07/31/2022 CLINICAL DATA:  Chest pain.  Headache. EXAM: CHEST - 2 VIEW COMPARISON:  None Available. FINDINGS: Cardiac silhouette is  normal in size. No mediastinal or hilar masses. No evidence of adenopathy. Clear lungs.  No pleural effusion or pneumothorax. Skeletal structures are unremarkable. IMPRESSION: No active cardiopulmonary disease. Electronically Signed   By: Amie Portland M.D.   On: 07/31/2022 13:57   DG Foot Complete Right  Result Date: 07/22/2022 CLINICAL DATA:  Right foot pain.  No known injury. EXAM: RIGHT FOOT COMPLETE - 3+ VIEW COMPARISON:  Right foot radiograph dated 07/17/2022. FINDINGS: There is no acute  fracture or dislocation. The bones are well mineralized. There is a moderate sized os navicularis. There is minimal soft tissue edema over the os navicularis. No radiopaque foreign object or soft tissue gas. IMPRESSION: 1. No acute osseous pathology. 2. 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 Electronically Signed   By: Elgie Collard M.D.   On: 07/22/2022 19:23   (Echo, Carotid, EGD, Colonoscopy, ERCP)    Subjective: Patient seen and examined.  Her wife was at the bedside and feeding her pancakes.  Patient still has some subcostal pain but much better than before.  Denies any shortness of breath at rest but gets short of breath on mobility. Asked for referral to congestive heart failure clinic and I sent a referral. Discussed different things, fluid and salt restrictions and patient and her wife is agreeable.   Discharge Exam: Vitals:   08/18/22 1018 08/18/22 1029  BP: 117/78   Pulse: 64   Resp: 18   Temp:  97.8 F (36.6 C)  SpO2: 94%    Vitals:   08/18/22 0730 08/18/22 0800 08/18/22 1018 08/18/22 1029  BP: 130/82 122/83 117/78   Pulse: (!) 57 63 64   Resp: 19  18   Temp:    97.8 F (36.6 C)  TempSrc:      SpO2: 90% 91% 94%     General: Pt is alert, awake, not in acute distress Obese, Cardiovascular: RRR, S1/S2 +, no rubs, no gallops Respiratory: CTA bilaterally, no wheezing, no rhonchi, difficult to auscultate but no added sounds. Abdominal: Soft, NT, ND, bowel sounds +, obese and pendulous.  No pitting edema. Extremities: no edema, no cyanosis.  No evidence of pitting edema.    The results of significant diagnostics from this hospitalization (including imaging, microbiology, ancillary and laboratory) are listed below for reference.     Microbiology: No results found for this or any previous visit (from the past 240 hour(s)).   Labs: BNP (last 3 results) Recent Labs    08/06/22 0648 08/17/22 1555  BNP 733.9* 265.5*   Basic Metabolic  Panel: Recent Labs  Lab 08/17/22 1555 08/17/22 2359 08/18/22 0545  NA 139  --  137  K 4.1  --  4.1  CL 106  --  104  CO2 25  --  24  GLUCOSE 92  --  128*  BUN 19  --  18  CREATININE 1.19* 1.20* 1.09*  CALCIUM 9.5  --  9.2  MG 2.1  --  2.1   Liver Function Tests: Recent Labs  Lab 08/17/22 1555 08/18/22 0545  AST 28 78*  ALT 26 62*  ALKPHOS 51 62  BILITOT 0.8 1.0  PROT 7.3 7.0  ALBUMIN 4.0 4.0   Recent Labs  Lab 08/17/22 2359  LIPASE 25   No results for input(s): "AMMONIA" in the last 168 hours. CBC: Recent Labs  Lab 08/17/22 1555 08/17/22 2359 08/18/22 0545  WBC 4.9 4.7 7.6  HGB 16.2* 15.6* 15.6*  HCT 49.6* 48.6* 48.3*  MCV 93.6 93.8  94.5  PLT 239 210 208   Cardiac Enzymes: No results for input(s): "CKTOTAL", "CKMB", "CKMBINDEX", "TROPONINI" in the last 168 hours. BNP: Invalid input(s): "POCBNP" CBG: No results for input(s): "GLUCAP" in the last 168 hours. D-Dimer Recent Labs    08/17/22 2359  DDIMER <0.27   Hgb A1c No results for input(s): "HGBA1C" in the last 72 hours. Lipid Profile No results for input(s): "CHOL", "HDL", "LDLCALC", "TRIG", "CHOLHDL", "LDLDIRECT" in the last 72 hours. Thyroid function studies No results for input(s): "TSH", "T4TOTAL", "T3FREE", "THYROIDAB" in the last 72 hours.  Invalid input(s): "FREET3" Anemia work up No results for input(s): "VITAMINB12", "FOLATE", "FERRITIN", "TIBC", "IRON", "RETICCTPCT" in the last 72 hours. Urinalysis No results found for: "COLORURINE", "APPEARANCEUR", "LABSPEC", "PHURINE", "GLUCOSEU", "HGBUR", "BILIRUBINUR", "KETONESUR", "PROTEINUR", "UROBILINOGEN", "NITRITE", "LEUKOCYTESUR" Sepsis Labs Recent Labs  Lab 08/17/22 1555 08/17/22 2359 08/18/22 0545  WBC 4.9 4.7 7.6   Microbiology No results found for this or any previous visit (from the past 240 hour(s)).   Time coordinating discharge:  35 minutes  SIGNED:   Dorcas Carrow, MD  [facility] 08/18/2022, 1:31 PM

## 2022-08-25 ENCOUNTER — Encounter (HOSPITAL_BASED_OUTPATIENT_CLINIC_OR_DEPARTMENT_OTHER): Payer: Self-pay

## 2022-08-25 DIAGNOSIS — G4733 Obstructive sleep apnea (adult) (pediatric): Secondary | ICD-10-CM

## 2022-09-01 ENCOUNTER — Encounter (HOSPITAL_BASED_OUTPATIENT_CLINIC_OR_DEPARTMENT_OTHER): Payer: Self-pay | Admitting: Cardiology

## 2022-09-01 ENCOUNTER — Ambulatory Visit (INDEPENDENT_AMBULATORY_CARE_PROVIDER_SITE_OTHER): Payer: 59 | Admitting: Cardiology

## 2022-09-01 VITALS — BP 116/78 | HR 65 | Ht 66.0 in | Wt 290.2 lb

## 2022-09-01 DIAGNOSIS — I5042 Chronic combined systolic (congestive) and diastolic (congestive) heart failure: Secondary | ICD-10-CM | POA: Diagnosis not present

## 2022-09-01 DIAGNOSIS — I428 Other cardiomyopathies: Secondary | ICD-10-CM | POA: Diagnosis not present

## 2022-09-01 DIAGNOSIS — R0789 Other chest pain: Secondary | ICD-10-CM | POA: Diagnosis not present

## 2022-09-01 MED ORDER — TORSEMIDE 20 MG PO TABS: 20.0000 mg | ORAL_TABLET | Freq: Every day | ORAL | 3 refills | Status: DC

## 2022-09-01 NOTE — Patient Instructions (Signed)
Medication Instructions:  Your Physician recommend you continue on your current medication as directed.    *If you need a refill on your cardiac medications before your next appointment, please call your pharmacy*   Lab Work: Please have your primary care fax lab work to [phone]   Testing/Procedures: None ordered today   Follow-Up: At [facility], you and your health needs are our priority.  As part of our continuing mission to provide you with exceptional heart care, we have created designated Provider Care Teams.  These Care Teams include your primary Cardiologist (physician) and Advanced Practice Providers (APPs -  Physician Assistants and Nurse Practitioners) who all work together to provide you with the care you need, when you need it.  We recommend signing up for the patient portal called "MyChart".  Sign up information is provided on this After Visit Summary.  MyChart is used to connect with patients for Virtual Visits (Telemedicine).  Patients are able to view lab/test results, encounter notes, upcoming appointments, etc.  Non-urgent messages can be sent to your provider as well.   To learn more about what you can do with MyChart, go to [email or web address].    Your next appointment:   3 -4week(s)  The format for your next appointment:   In Person  Provider:   Jodelle Red, MD    Please keep a log of your weight daily

## 2022-09-01 NOTE — Addendum Note (Signed)
Addended by: Parke Poisson on: 09/01/2022 01:52 PM   Modules accepted: Orders

## 2022-09-01 NOTE — Progress Notes (Signed)
Cardiology Office Note:    Date:  09/01/2022   ID:  TABBETHA KUTSCHER, DOB 1986-10-28, MRN [id number]  PCP:  Loney Laurence, NP  Cardiologist:  Jodelle Red, MD  CC: post hospital follow up  History of Present Illness:    Michelle Strickland is a 36 y.o. adult with a hx of nonischemic cardiomyopathy, chronic systolic and diastolic heart failure, COPD, asthma, depression, PTSD, schizophrenia, and morbid obesity, who is seen for post hospital follow-up.  He has had multiple admissions in the past few months, ED and hospital notes personally reviewed.   On 07/31/2022 he was admitted with concerns for chest pain. Troponin levels were abnormal but flat trend. EKG nonischemic. Echocardiogram showed EF of 45% with global hypokinesis. D-dimer was noted to be normal. LDL 104. Cardiology recommended coronary CTA which showed no significant disease. Cardiology recommended low-dose beta-blocker and ARB which were initiated. No aspirin or statin, planned to discuss this further at outpatient follow-up.  He was then admitted 08/06/2022 with complaints of progressively worse dyspnea, pleuritic chest pain, orthopnea, PND and lower extremity edema since leaving the hospital. He was given IV lasix. Unremarkable D-dimer. Troponin was 47 then 38 ng/L. BNP 734 pg/mL. Two-view chest radiograph showed cardiomegaly with pulmonary venous congestion, but no frank pulmonary edema. Discharged with PO lasix 40 mg.  On 08/17/2022 he presented to the ED complaining of shortness of breath and chest pain. He noted decreased urination and increased weight. Chest x-ray was clear and BNP improved to 270. Of note, when the nurse tried to ambulate the patient, his oxygen remained normal but his heart rate dropped down to the 30s. There was consideration for symptomatic bradycardia. He was admitted for telemetry monitoring. His EKG showed sinus rhythm at 56 bpm, considered right atrial enlargement, right axis deviation.  Discharged 08/18/22.  Today: He is accompanied his spouse today.   He still experiences episodes of chest pains when upset, initially starting as a headache. Then he develops shortness of breath. He is unable to breathe, "feeling like an elephant sits on his chest". He denies recent syncopal episodes (one remote episode in his life). However, he will develop presyncopal feelings associated with seeing colors in his vision. When he does feel this way he will leave and calm down.  He also describes episodes of presyncope that are associated with becoming flushed and diaphoretic. This may occur while others are too cold. In a day he might have 4-5 of these episodes.   When severe, he will notice swelling and puffiness in his face and abdomen. He states that his diuretics do not seem to removing the fluid around his lungs. Lately the amount of urination has been decreased. Previously he was able to take the fluid pills with significant improvement in symptoms. He endorses weight gain. At home his weight has recently increased from 280 lbs to 290 lbs. Also he has been decreasing the amount of fluid he drinks. They are working on eliminating salt from his diet, and are typically avoiding ordering out.  Additionally he endorses coughing and sputum production in the past few days.  No recent strenuous activities, but he would like to return to work.  In his regimen, he confirms that he is still taking metoprolol.  He denies any palpitations, orthopnea, or PND.  Past Medical History:  Diagnosis Date   Asthma    Bronchitis    Chronic diarrhea    COPD (chronic obstructive pulmonary disease) (HCC)    Depression  Environmental and seasonal allergies    Family history of adverse reaction to anesthesia    " My sister vomited and aspirated during surgery; they gave her too much anesthesia"   Fatty liver    H/O heartburn    Morbid obesity (HCC)    Osteoarthritis of right acromioclavicular joint     PTSD (post-traumatic stress disorder)    Schizophrenia (HCC)    Tendinosis of rotator cuff    impingement   Wears glasses     Past Surgical History:  Procedure Laterality Date   CHOLECYSTECTOMY     COLONOSCOPY     SHOULDER ARTHROSCOPY Right 01/31/2019   Procedure: RIGHT SHOULDER ARTHROSCOPIC DEBRIDEMENT ROTATOR CUFF; SUBACROMIAL DECOMPRESSION; DISTAL CLAVICLE EXCISION;  Surgeon: Jones Broom, MD;  Location: [facility] OR;  Service: Orthopedics;  Laterality: Right;   WISDOM TOOTH EXTRACTION      Current Medications: Current Outpatient Medications on File Prior to Visit  Medication Sig   lamoTRIgine (LAMICTAL) 25 MG tablet Take 25 mg by mouth daily.   losartan (COZAAR) 25 MG tablet Take 0.5 tablets (12.5 mg total) by mouth daily.   QUEtiapine (SEROQUEL) 25 MG tablet Take 25 mg by mouth at bedtime.   testosterone enanthate (DELATESTRYL) 200 MG/ML injection Inject 200 mg into the muscle every 7 (seven) days. For IM use only   thiamine (VITAMIN B1) 100 MG tablet Take 1 tablet (100 mg total) by mouth daily.   torsemide (DEMADEX) 20 MG tablet Take 1 tablet (20 mg total) by mouth daily.   venlafaxine XR (EFFEXOR-XR) 37.5 MG 24 hr capsule Take 37.5 mg by mouth every morning.   No current facility-administered medications on file prior to visit.     Allergies:   Contrast media [iodinated contrast media], Penicillins, Toradol [ketorolac tromethamine], Tramadol, Nsaids, and Watermelon [citrullus vulgaris]   Social History   Tobacco Use   Smoking status: Never   Smokeless tobacco: Never  Vaping Use   Vaping Use: Never used  Substance Use Topics   Alcohol use: Yes    Comment: occasional   Drug use: Not Currently    Family History: family history includes Heart disease in his father and paternal grandfather.  ROS:   Please see the history of present illness. (+) Chest pain (+) Headaches (+) Shortness of breath (+) Weight gain (+) Productive cough (+) Presyncope (+) Diaphoresis All  other systems are reviewed and negative.    EKGs/Labs/Other Studies Reviewed:    The following studies were reviewed today:  Coronary CTA  08/02/2022: IMPRESSION: 1. No evidence of CAD, CADRADS = 0.   2. Coronary calcium score of 0. This was 0 percentile for age and sex matched control.   3. Normal coronary origin with right dominance.   4. Dilated size of the pulmonary artery, measuring 35 mm.  Echo  08/01/2022: Sonographer Comments: Technically difficult study due to poor echo windows and patient is morbidly obese. Image acquisition challenging due to patient body habitus. Delay sending study  IMPRESSIONS    1. Left ventricular ejection fraction by 3D volume is 45 %. The left  ventricle has mildly decreased function. The left ventricle demonstrates  global hypokinesis. There is mild left ventricular hypertrophy. Left  ventricular diastolic parameters are  consistent with Grade II diastolic dysfunction (pseudonormalization).   2. Right ventricular systolic function is normal. The right ventricular  size is normal.   3. Left atrial size was mildly dilated.   4. The mitral valve is normal in structure. Trivial mitral valve  regurgitation. No  evidence of mitral stenosis.   5. The aortic valve is tricuspid. Aortic valve regurgitation is not  visualized. No aortic stenosis is present.   6. The inferior vena cava is dilated in size with <50% respiratory  variability, suggesting right atrial pressure of 15 mmHg.   EKG:  EKG is personally reviewed.   09/01/2022:  not ordered today  Recent Labs: 07/31/2022: TSH 2.306 08/17/2022: B Natriuretic Peptide 265.5 08/18/2022: ALT 62; BUN 18; Creatinine, Ser 1.09; Hemoglobin 15.6; Magnesium 2.1; Platelets 208; Potassium 4.1; Sodium 137   Recent Lipid Panel    Component Value Date/Time   CHOL 153 08/01/2022 1458   TRIG 69 08/01/2022 1458   HDL 35 (L) 08/01/2022 1458   CHOLHDL 4.4 08/01/2022 1458   VLDL 14 08/01/2022 1458   LDLCALC 104 (H)  08/01/2022 1458    Physical Exam:    VS:  BP 116/78 (BP Location: Right Arm, Patient Position: Sitting, Cuff Size: Large)   Pulse 65   Ht 5\' 6"  (1.676 m)   Wt 290 lb 3.2 oz (131.6 kg)   SpO2 95%   BMI 46.84 kg/m     Wt Readings from Last 3 Encounters:  09/01/22 290 lb 3.2 oz (131.6 kg)  08/07/22 281 lb 15.5 oz (127.9 kg)  07/31/22 280 lb (127 kg)    GEN: Well nourished, well developed in no acute distress HEENT: Normal, moist mucous membranes NECK: No JVD CARDIAC: regular rhythm, normal S1 and S2, no rubs or gallops. No murmur. VASCULAR: Radial and DP pulses 2+ bilaterally. No carotid bruits RESPIRATORY:  Clear to auscultation without rales, wheezing or rhonchi  ABDOMEN: Soft, non-tender, non-distended MUSCULOSKELETAL:  Ambulates independently SKIN: Warm and dry, no edema NEUROLOGIC:  Alert and oriented x 3. No focal neuro deficits noted. PSYCHIATRIC:  Normal affect    ASSESSMENT:    1. Nonischemic cardiomyopathy (HCC)   2. Chronic combined systolic and diastolic heart failure (HCC)   3. Atypical chest pain   4. Class 3 obesity (HCC)    PLAN:    Chest pain, intermittent -evaluated with CCTA while admitted, not ischemic -this occurs with high stress situations. Suspect sympathetic component to is -reviewed red flag warning signs that need immediate medical attention  Nonischemic cardiomyopathy Weight gain -he is euvolemic on exam, no JVD even with hepatic pressure. No edema, lungs clear -reports decreased urine output, but also has decreased his fluid intake and sodium intake -suspect that weight gain is not due to fluid -he reports recent labs at his PCP with worsening renal function. We have asked for these to be faxed. Prior labs showed improving renal function -he did not stop taking metoprolol, and I agree with this. He augments his heart rate appropriately with activity. Do not think he has symptomatic bradycardia, see prior notes -continue metoprolol,  losartan -will hold on SGLT2i until we get lab results from his PCP -BMI up to 46 today. Discussed that this may be non-cardiac/non-fluid weight gain. He will keep a log and we will review trend at follow up.  Hot flashes/diaphoresis -suspect this may be 2/2 hormone changes with testosterone use  Cardiac risk counseling and prevention recommendations: -recommend heart healthy/Mediterranean diet, with whole grains, fruits, vegetable, fish, lean meats, nuts, and olive oil. Limit salt. -recommend moderate walking, 3-5 times/week for 30-50 minutes each session. Aim for at least 150 minutes.week. Goal should be pace of 3 miles/hours, or walking 1.5 miles in 30 minutes -recommend avoidance of tobacco products. Avoid excess alcohol.  Plan for follow  up: 3-4 weeks or sooner as needed.  Jodelle Red, MD, PhD, [facility]  [facility]    Medication Adjustments/Labs and Tests Ordered: Current medicines are reviewed at length with the patient today.  Concerns regarding medicines are outlined above.   No orders of the defined types were placed in this encounter.  No orders of the defined types were placed in this encounter.  Patient Instructions  Medication Instructions:  Your Physician recommend you continue on your current medication as directed.    *If you need a refill on your cardiac medications before your next appointment, please call your pharmacy*   Lab Work: Please have your primary care fax lab work to [phone]   Testing/Procedures: None ordered today   Follow-Up: At [facility], you and your health needs are our priority.  As part of our continuing mission to provide you with exceptional heart care, we have created designated Provider Care Teams.  These Care Teams include your primary Cardiologist (physician) and Advanced Practice Providers (APPs -  Physician Assistants and Nurse Practitioners) who all work together to provide you with the care  you need, when you need it.  We recommend signing up for the patient portal called "MyChart".  Sign up information is provided on this After Visit Summary.  MyChart is used to connect with patients for Virtual Visits (Telemedicine).  Patients are able to view lab/test results, encounter notes, upcoming appointments, etc.  Non-urgent messages can be sent to your provider as well.   To learn more about what you can do with MyChart, go to [email or web address].    Your next appointment:   3 -4week(s)  The format for your next appointment:   In Person  Provider:   Jodelle Red, MD    Please keep a log of your weight daily        I,Mathew Stumpf,acting as a scribe for Jodelle Red, MD.,have documented all relevant documentation on the behalf of Jodelle Red, MD,as directed by  Jodelle Red, MD while in the presence of Jodelle Red, MD.  I, Jodelle Red, MD, have reviewed all documentation for this visit. The documentation on 09/01/22 for the exam, diagnosis, procedures, and orders are all accurate and complete.   Signed, Jodelle Red, MD PhD 09/01/2022 12:56 PM    [facility]

## 2022-09-27 ENCOUNTER — Encounter (HOSPITAL_BASED_OUTPATIENT_CLINIC_OR_DEPARTMENT_OTHER): Payer: Self-pay | Admitting: Cardiology

## 2022-09-27 ENCOUNTER — Ambulatory Visit (HOSPITAL_BASED_OUTPATIENT_CLINIC_OR_DEPARTMENT_OTHER): Payer: 59 | Attending: Internal Medicine | Admitting: Internal Medicine

## 2022-09-27 ENCOUNTER — Ambulatory Visit (INDEPENDENT_AMBULATORY_CARE_PROVIDER_SITE_OTHER): Payer: 59 | Admitting: Cardiology

## 2022-09-27 VITALS — BP 121/87 | HR 76 | Ht 66.0 in | Wt 295.6 lb

## 2022-09-27 DIAGNOSIS — Z713 Dietary counseling and surveillance: Secondary | ICD-10-CM | POA: Diagnosis not present

## 2022-09-27 DIAGNOSIS — G4733 Obstructive sleep apnea (adult) (pediatric): Secondary | ICD-10-CM

## 2022-09-27 DIAGNOSIS — Z7182 Exercise counseling: Secondary | ICD-10-CM

## 2022-09-27 DIAGNOSIS — I428 Other cardiomyopathies: Secondary | ICD-10-CM | POA: Diagnosis not present

## 2022-09-27 DIAGNOSIS — R0789 Other chest pain: Secondary | ICD-10-CM | POA: Diagnosis not present

## 2022-09-27 MED ORDER — LOSARTAN POTASSIUM 25 MG PO TABS: 12.5000 mg | ORAL_TABLET | Freq: Every day | ORAL | 3 refills | Status: DC

## 2022-09-27 MED ORDER — SEMAGLUTIDE-WEIGHT MANAGEMENT 1 MG/0.5ML ~~LOC~~ SOAJ: 1.0000 mg | SUBCUTANEOUS | 0 refills | Status: DC

## 2022-09-27 MED ORDER — SEMAGLUTIDE-WEIGHT MANAGEMENT 0.5 MG/0.5ML ~~LOC~~ SOAJ: 0.5000 mg | SUBCUTANEOUS | 0 refills | Status: DC

## 2022-09-27 MED ORDER — SEMAGLUTIDE-WEIGHT MANAGEMENT 1.7 MG/0.75ML ~~LOC~~ SOAJ: 1.7000 mg | SUBCUTANEOUS | 0 refills | Status: DC

## 2022-09-27 MED ORDER — SEMAGLUTIDE-WEIGHT MANAGEMENT 2.4 MG/0.75ML ~~LOC~~ SOAJ: 2.4000 mg | SUBCUTANEOUS | 8 refills | Status: DC

## 2022-09-27 MED ORDER — METOPROLOL SUCCINATE ER 25 MG PO TB24: 12.5000 mg | ORAL_TABLET | Freq: Every day | ORAL | 3 refills | Status: DC

## 2022-09-27 NOTE — Patient Instructions (Addendum)
Semaglutide starting dose plan: -Start with the 0.25 mg dose every week for 4 weeks, -Then use the 0.5 mg dose every week for 4 weeks, -Then continue with 1 mg dose every week. We will meet when you are on the 1 mg dose to discuss timing of uptitration.  Some nausea is normal, as is decreased appetite. If you have severe nausea or stomach pain, please contact us and stop the medication.   Teaching done with demo pen today and samples given today.   Follow up in 3 months, 01/03/2023 10:00 am with Dr Harrell Gave

## 2022-09-27 NOTE — Progress Notes (Signed)
Cardiology Office Note:    Date:  09/27/2022   ID:  Michelle Strickland, DOB Apr 03, 1986, MRN [id number]  PCP:  Eben Burow, NP  Cardiologist:  Buford Dresser, MD  CC: follow up  History of Present Illness:    Michelle Strickland is a 36 y.o. adult with a hx of nonischemic cardiomyopathy, chronic systolic and diastolic heart failure, COPD, asthma, depression, PTSD, schizophrenia, and morbid obesity, who is seen for post hospital follow-up.  He has had multiple admissions in the past few months, ED and hospital notes personally reviewed.   On 07/31/2022 he was admitted with concerns for chest pain. Troponin levels were abnormal but flat trend. EKG nonischemic. Echocardiogram showed EF of 45% with global hypokinesis. D-dimer was noted to be normal. LDL 104. Cardiology recommended coronary CTA which showed no significant disease. Cardiology recommended low-dose beta-blocker and ARB which were initiated. No aspirin or statin, planned to discuss this further at outpatient follow-up.  He was then admitted 08/06/2022 with complaints of progressively worse dyspnea, pleuritic chest pain, orthopnea, PND and lower extremity edema since leaving the hospital. He was given IV lasix. Unremarkable D-dimer. Troponin was 47 then 38 ng/L. BNP 734 pg/mL. Two-view chest radiograph showed cardiomegaly with pulmonary venous congestion, but no frank pulmonary edema. Discharged with PO lasix 40 mg.  On 08/17/2022 he presented to the ED complaining of shortness of breath and chest pain. He noted decreased urination and increased weight. Chest x-ray was clear and BNP improved to 270. Of note, when the nurse tried to ambulate the patient, his oxygen remained normal but his heart rate dropped down to the 30s. There was consideration for symptomatic bradycardia. He was admitted for telemetry monitoring. His EKG showed sinus rhythm at 56 bpm, considered right atrial enlargement, right axis deviation. Discharged  08/18/22.  At his last visit on 09/01/2022, He reports that he is still experiencing chest pain when he is upset, though it initially starts as a headache. He felt shortness of breath and "like a elephant is sitting on his chest". He also would develop presyncopal feeling but would not actually have a full syncopal episode. He also had some episodes where this presyncopal feeling was associated with becoming flushed a diaphoretic. He is having maybe 4-5 episodes a day. He reported that when those episodes were severe, he will notice swelling and puffiness in his face and abdomen. He had not been urinating as much as he normally does and felt the diuretics were not effectively removing the fluid from his lungs. His weight had increased by 10 pounds recently. He was still taking the metoptolol.   Today he says he is feeling ok for the most part. He has been trying to keep a check on his weight since his last visit. It has stayed at 286, so maybe not fluid. He feels that despite focusing on eating less, he keeps getting bigger. He is also concerned about getting the weight off so he can get approved for his top surgery.   If he has a period of coughing, he will sometimes feel like he is going to pass out. Other than those times he has not been having anymore presyncopal episodes.   He is still concerned that he is not urinating a proper amount and that may be contributing to the weight gain.    He denies any palpitations, chest pain, shortness of breath, or peripheral edema. No headaches, syncope, orthopnea, or PND.     Past Medical History:  Diagnosis Date  Asthma    Bronchitis    Chronic diarrhea    COPD (chronic obstructive pulmonary disease) (HCC)    Depression    Environmental and seasonal allergies    Family history of adverse reaction to anesthesia    " My sister vomited and aspirated during surgery; they gave her too much anesthesia"   Fatty liver    H/O heartburn    Morbid obesity  (Michelle Strickland)    Osteoarthritis of right acromioclavicular joint    PTSD (post-traumatic stress disorder)    Schizophrenia ([place])    Tendinosis of rotator cuff    impingement   Wears glasses     Past Surgical History:  Procedure Laterality Date   CHOLECYSTECTOMY     COLONOSCOPY     SHOULDER ARTHROSCOPY Right 01/31/2019   Procedure: RIGHT SHOULDER ARTHROSCOPIC DEBRIDEMENT ROTATOR CUFF; SUBACROMIAL DECOMPRESSION; DISTAL CLAVICLE EXCISION;  Surgeon: Tania Ade, MD;  Location: [facility];  Service: Orthopedics;  Laterality: Right;   WISDOM TOOTH EXTRACTION      Current Medications: Current Outpatient Medications on File Prior to Visit  Medication Sig   lamoTRIgine (LAMICTAL) 25 MG tablet Take 25 mg by mouth daily.   losartan (COZAAR) 25 MG tablet Take 0.5 tablets (12.5 mg total) by mouth daily.   QUEtiapine (SEROQUEL) 25 MG tablet Take 25 mg by mouth at bedtime.   testosterone enanthate (DELATESTRYL) 200 MG/ML injection Inject 200 mg into the muscle every 7 (seven) days. For IM use only   thiamine (VITAMIN B1) 100 MG tablet Take 1 tablet (100 mg total) by mouth daily.   torsemide (DEMADEX) 20 MG tablet Take 1 tablet (20 mg total) by mouth daily.   venlafaxine XR (EFFEXOR-XR) 37.5 MG 24 hr capsule Take 37.5 mg by mouth every morning.   No current facility-administered medications on file prior to visit.     Allergies:   Contrast media [iodinated contrast media], Penicillins, Toradol [ketorolac tromethamine], Tramadol, Nsaids, and Watermelon [citrullus vulgaris]   Social History   Tobacco Use   Smoking status: Never   Smokeless tobacco: Never  Vaping Use   Vaping Use: Never used  Substance Use Topics   Alcohol use: Yes    Comment: occasional   Drug use: Not Currently    Family History: family history includes Heart disease in his father and paternal grandfather.  ROS:   Please see the history of present illness. (+)lightheadedness with coughing All other systems are reviewed and  negative.    EKGs/Labs/Other Studies Reviewed:    The following studies were reviewed today:  Coronary CTA  08/02/2022: IMPRESSION: 1. No evidence of CAD, CADRADS = 0.   2. Coronary calcium score of 0. This was 0 percentile for age and sex matched control.   3. Normal coronary origin with right dominance.   4. Dilated size of the pulmonary artery, measuring 35 mm.  Echo  08/01/2022: Sonographer Comments: Technically difficult study due to poor echo windows and patient is morbidly obese. Image acquisition challenging due to patient body habitus. Delay sending study  IMPRESSIONS    1. Left ventricular ejection fraction by 3D volume is 45 %. The left  ventricle has mildly decreased function. The left ventricle demonstrates  global hypokinesis. There is mild left ventricular hypertrophy. Left  ventricular diastolic parameters are  consistent with Grade II diastolic dysfunction (pseudonormalization).   2. Right ventricular systolic function is normal. The right ventricular  size is normal.   3. Left atrial size was mildly dilated.   4. The mitral valve  is normal in structure. Trivial mitral valve  regurgitation. No evidence of mitral stenosis.   5. The aortic valve is tricuspid. Aortic valve regurgitation is not  visualized. No aortic stenosis is present.   6. The inferior vena cava is dilated in size with <50% respiratory  variability, suggesting right atrial pressure of 15 mmHg.   EKG:  EKG is personally reviewed.   09/27/2022: not ordered today 09/01/2022:  not ordered today  Recent Labs: 07/31/2022: TSH 2.306 08/17/2022: B Natriuretic Peptide 265.5 08/18/2022: ALT 62; BUN 18; Creatinine, Ser 1.09; Hemoglobin 15.6; Magnesium 2.1; Platelets 208; Potassium 4.1; Sodium 137   Recent Lipid Panel    Component Value Date/Time   CHOL 153 08/01/2022 1458   TRIG 69 08/01/2022 1458   HDL 35 (L) 08/01/2022 1458   CHOLHDL 4.4 08/01/2022 1458   VLDL 14 08/01/2022 1458   LDLCALC 104 (H)  08/01/2022 1458    Physical Exam:    VS:  BP 121/87 (BP Location: Left Arm, Patient Position: Sitting, Cuff Size: Large)   Pulse 76   Ht 5\' 6"  (1.676 m)   Wt 295 lb 9.6 oz (134.1 kg)   SpO2 99%   BMI 47.71 kg/m     Wt Readings from Last 3 Encounters:  09/01/22 290 lb 3.2 oz (131.6 kg)  08/07/22 281 lb 15.5 oz (127.9 kg)  07/31/22 280 lb (127 kg)    GEN: Well nourished, well developed in no acute distress HEENT: Normal, moist mucous membranes NECK: No JVD CARDIAC: regular rhythm, normal S1 and S2, no rubs or gallops. No murmur. VASCULAR: Radial and DP pulses 2+ bilaterally. No carotid bruits RESPIRATORY:  Clear to auscultation without rales, wheezing or rhonchi  ABDOMEN: Soft, non-tender, non-distended MUSCULOSKELETAL:  Ambulates independently SKIN: Warm and dry, no edema NEUROLOGIC:  Alert and oriented x 3. No focal neuro deficits noted. PSYCHIATRIC:  Normal affect    ASSESSMENT:    1. Nonischemic cardiomyopathy (HCC)   2. Class 3 obesity ([place])   3. Atypical chest pain   4. Nutritional counseling   5. Exercise counseling     PLAN:    Chest pain, intermittent -evaluated with CCTA while admitted, not ischemic -this occurs with high stress situations. Suspect sympathetic component to is -reviewed red flag warning signs that need immediate medical attention  Nonischemic cardiomyopathy Weight gain Obesity, BMI 47 -he is euvolemic on exam -I do not think he has symptomatic bradycardia, see prior notes -continue metoprolol, losartan -if renal function stable, re-discuss SGLT2i at follow up.  -Given weight, obesity, we discussed GLP1RA today. He is amenable. Instructed on administration, dosing, and side effects -BMI up to 47 today. Discussed that this may be non-cardiac/non-fluid weight gain. He will keep a log and we will review trend at follow up.  Hot flashes/diaphoresis -suspect this may be 2/2 hormone changes with testosterone use  Cardiac risk counseling and  prevention recommendations: -recommend heart healthy/Mediterranean diet, with whole grains, fruits, vegetable, fish, lean meats, nuts, and olive oil. Limit salt. -recommend moderate walking, 3-5 times/week for 30-50 minutes each session. Aim for at least 150 minutes.week. Goal should be pace of 3 miles/hours, or walking 1.5 miles in 30 minutes -recommend avoidance of tobacco products. Avoid excess alcohol.  Plan for follow up: 3 months  Buford Dresser, MD, PhD, [facility]    Medication Adjustments/Labs and Tests Ordered: Current medicines are reviewed at length with the patient today.  Concerns regarding medicines are outlined above.   No orders of  the defined types were placed in this encounter.  Meds ordered this encounter  Medications   DISCONTD: metoprolol succinate (TOPROL-XL) 25 MG 24 hr tablet    Sig: Take 0.5 tablets (12.5 mg total) by mouth daily.    Dispense:  45 tablet    Refill:  3   DISCONTD: losartan (COZAAR) 25 MG tablet    Sig: Take 0.5 tablets (12.5 mg total) by mouth daily.    Dispense:  45 tablet    Refill:  3   DISCONTD: Semaglutide-Weight Management 0.5 MG/0.5ML SOAJ    Sig: Inject 0.5 mg into the skin once a week for 28 days.    Dispense:  2 mL    Refill:  0   DISCONTD: Semaglutide-Weight Management 1 MG/0.5ML SOAJ    Sig: Inject 1 mg into the skin once a week for 28 days.    Dispense:  2 mL    Refill:  0   DISCONTD: Semaglutide-Weight Management 1.7 MG/0.75ML SOAJ    Sig: Inject 1.7 mg into the skin once a week for 28 days.    Dispense:  3 mL    Refill:  0   DISCONTD: Semaglutide-Weight Management 2.4 MG/0.75ML SOAJ    Sig: Inject 2.4 mg into the skin once a week.    Dispense:  3 mL    Refill:  8   Patient Instructions  Semaglutide starting dose plan: -Start with the 0.25 mg dose every week for 4 weeks, -Then use the 0.5 mg dose every week for 4 weeks, -Then continue with 1 mg dose every week. We will meet when you are  on the 1 mg dose to discuss timing of uptitration.  Some nausea is normal, as is decreased appetite. If you have severe nausea or stomach pain, please contact us and stop the medication.   Teaching done with demo pen today and samples given today.   Follow up in 3 months, 01/03/2023 10:00 am with Dr Harrell Gave     Burnett Kanaris Ford,acting as a scribe for Buford Dresser, MD.,have documented all relevant documentation on the behalf of Buford Dresser, MD,as directed by  Buford Dresser, MD while in the presence of Buford Dresser, MD.  I, Buford Dresser, MD, have reviewed all documentation for this visit. The documentation on 01/15/23 for the exam, diagnosis, procedures, and orders are all accurate and complete.   Signed, Buford Dresser, MD PhD 09/27/2022     [place]

## 2022-09-30 ENCOUNTER — Telehealth: Payer: Self-pay | Admitting: Cardiology

## 2022-09-30 NOTE — Telephone Encounter (Signed)
Patient following up on a medication denial

## 2022-09-30 NOTE — Telephone Encounter (Signed)
Patient would like for Dr. Harrell Gave or nurse to call back in regards to medication that was denied

## 2022-10-03 NOTE — Telephone Encounter (Signed)
Pt updated and declined referral

## 2022-10-03 NOTE — Telephone Encounter (Signed)
Some insurance plans do not cover medication for weight loss. If his insurance dose not cover Wegovy, unfortunately there is not an alternative as he is not diabetic and would need to proceed with weight loss via diet and exercise. Can offer referral to [facility].   Michelle Dubonnet, NP

## 2022-10-03 NOTE — Telephone Encounter (Signed)
Pt calling to f/u on call from 10/6 regarding an alternate mediation for Wegovy being that her insurance will not cover it. Please advise

## 2022-10-19 ENCOUNTER — Encounter (HOSPITAL_BASED_OUTPATIENT_CLINIC_OR_DEPARTMENT_OTHER): Payer: Self-pay

## 2022-10-19 NOTE — Telephone Encounter (Signed)
Returned call to patient who is asking for [facility] for weight loss. Explained to patient unless he has a documented diagnosis of diabetes insurance will not cover Mounjaro.  Encouraged patient to contact PCP to discuss possibilities.  Pt states going to the gym is not doing it. Pt agreed to check with his PCP. Georgana Curio MHA RN CCM

## 2022-10-19 NOTE — Telephone Encounter (Signed)
Please advise 

## 2022-11-01 ENCOUNTER — Encounter (HOSPITAL_BASED_OUTPATIENT_CLINIC_OR_DEPARTMENT_OTHER): Payer: Self-pay

## 2022-11-01 DIAGNOSIS — I428 Other cardiomyopathies: Secondary | ICD-10-CM

## 2022-11-01 MED ORDER — TORSEMIDE 20 MG PO TABS: 20.0000 mg | ORAL_TABLET | Freq: Every day | ORAL | 3 refills | Status: DC

## 2022-11-01 MED ORDER — METOPROLOL SUCCINATE ER 25 MG PO TB24: 12.5000 mg | ORAL_TABLET | Freq: Every day | ORAL | 3 refills | Status: DC

## 2022-11-01 MED ORDER — LOSARTAN POTASSIUM 25 MG PO TABS: 12.5000 mg | ORAL_TABLET | Freq: Every day | ORAL | 3 refills | Status: DC

## 2022-12-14 ENCOUNTER — Emergency Department (HOSPITAL_BASED_OUTPATIENT_CLINIC_OR_DEPARTMENT_OTHER): Payer: 59 | Admitting: Radiology

## 2022-12-14 ENCOUNTER — Other Ambulatory Visit: Payer: Self-pay

## 2022-12-14 ENCOUNTER — Emergency Department (HOSPITAL_BASED_OUTPATIENT_CLINIC_OR_DEPARTMENT_OTHER)
Admission: EM | Admit: 2022-12-14 | Discharge: 2022-12-14 | Disposition: A | Discharge status: Home / Self Care | Payer: 59 | Attending: Emergency Medicine | Admitting: Emergency Medicine

## 2022-12-14 DIAGNOSIS — S61011A Laceration without foreign body of right thumb without damage to nail, initial encounter: Secondary | ICD-10-CM | POA: Insufficient documentation

## 2022-12-14 DIAGNOSIS — J449 Chronic obstructive pulmonary disease, unspecified: Secondary | ICD-10-CM | POA: Insufficient documentation

## 2022-12-14 DIAGNOSIS — S6991XA Unspecified injury of right wrist, hand and finger(s), initial encounter: Secondary | ICD-10-CM

## 2022-12-14 DIAGNOSIS — Z23 Encounter for immunization: Secondary | ICD-10-CM | POA: Diagnosis not present

## 2022-12-14 MED ORDER — TETANUS-DIPHTH-ACELL PERTUSSIS 5-2.5-18.5 LF-MCG/0.5 IM SUSY
0.5000 mL | PREFILLED_SYRINGE | Freq: Once | INTRAMUSCULAR | Status: AC
  Administered 2022-12-14: 0.5 mL via INTRAMUSCULAR
  Filled 2022-12-14: qty 0.5

## 2022-12-14 MED ORDER — CLINDAMYCIN HCL 150 MG PO CAPS: 450.0000 mg | ORAL_CAPSULE | Freq: Three times a day (TID) | ORAL | 0 refills | Status: AC

## 2022-12-14 MED ORDER — CIPROFLOXACIN HCL 500 MG PO TABS
500.0000 mg | ORAL_TABLET | Freq: Two times a day (BID) | ORAL | 0 refills | Status: AC
Start: 2022-12-14 — End: 2022-12-21

## 2022-12-14 MED ORDER — BACITRACIN ZINC 500 UNIT/GM EX OINT: TOPICAL_OINTMENT | Freq: Once | CUTANEOUS | Status: DC
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
## 2023-01-01 DIAGNOSIS — F6381 Intermittent explosive disorder: Secondary | ICD-10-CM | POA: Diagnosis not present

## 2023-01-01 DIAGNOSIS — F39 Unspecified mood [affective] disorder: Secondary | ICD-10-CM | POA: Diagnosis not present

## 2023-01-01 DIAGNOSIS — F419 Anxiety disorder, unspecified: Secondary | ICD-10-CM | POA: Diagnosis not present

## 2023-01-03 ENCOUNTER — Ambulatory Visit (HOSPITAL_BASED_OUTPATIENT_CLINIC_OR_DEPARTMENT_OTHER): Payer: 59 | Admitting: Cardiology

## 2023-01-05 ENCOUNTER — Telehealth (HOSPITAL_COMMUNITY): Payer: Self-pay | Admitting: Vascular Surgery

## 2023-01-05 NOTE — Telephone Encounter (Signed)
Pt states he is already seeing a heart doctor and does not want to make appt in the [facility]

## 2023-01-15 ENCOUNTER — Encounter (HOSPITAL_BASED_OUTPATIENT_CLINIC_OR_DEPARTMENT_OTHER): Payer: Self-pay | Admitting: Cardiology

## 2023-02-22 ENCOUNTER — Ambulatory Visit (INDEPENDENT_AMBULATORY_CARE_PROVIDER_SITE_OTHER): Payer: 59 | Admitting: Cardiology

## 2023-02-22 ENCOUNTER — Encounter (HOSPITAL_BASED_OUTPATIENT_CLINIC_OR_DEPARTMENT_OTHER): Payer: Self-pay | Admitting: Cardiology

## 2023-02-22 ENCOUNTER — Other Ambulatory Visit (HOSPITAL_BASED_OUTPATIENT_CLINIC_OR_DEPARTMENT_OTHER): Payer: Self-pay

## 2023-02-22 VITALS — BP 136/98 | HR 66 | Ht 66.0 in | Wt 264.0 lb

## 2023-02-22 DIAGNOSIS — Z7182 Exercise counseling: Secondary | ICD-10-CM

## 2023-02-22 DIAGNOSIS — R0789 Other chest pain: Secondary | ICD-10-CM | POA: Diagnosis not present

## 2023-02-22 DIAGNOSIS — I428 Other cardiomyopathies: Secondary | ICD-10-CM | POA: Diagnosis not present

## 2023-02-22 DIAGNOSIS — Z6841 Body Mass Index (BMI) 40.0 and over, adult: Secondary | ICD-10-CM

## 2023-02-22 DIAGNOSIS — Z713 Dietary counseling and surveillance: Secondary | ICD-10-CM

## 2023-02-22 MED ORDER — SEMAGLUTIDE-WEIGHT MANAGEMENT 0.5 MG/0.5ML ~~LOC~~ SOAJ
0.5000 mg | SUBCUTANEOUS | 1 refills | Status: DC
  Filled 2023-02-22 – 2023-04-03 (×2): qty 2, 28d supply, fill #0

## 2023-02-22 MED ORDER — WEGOVY 0.25 MG/0.5ML ~~LOC~~ SOAJ: 0.2500 mg | SUBCUTANEOUS | 0 refills | Status: DC

## 2023-02-22 MED ORDER — SEMAGLUTIDE-WEIGHT MANAGEMENT 2.4 MG/0.75ML ~~LOC~~ SOAJ
2.4000 mg | SUBCUTANEOUS | 1 refills | Status: AC
  Filled 2023-02-22: qty 3, fill #0

## 2023-02-22 MED ORDER — SEMAGLUTIDE-WEIGHT MANAGEMENT 1.7 MG/0.75ML ~~LOC~~ SOAJ
1.7000 mg | SUBCUTANEOUS | 1 refills | Status: AC
  Filled 2023-02-22: qty 3, fill #0

## 2023-02-22 MED ORDER — SEMAGLUTIDE-WEIGHT MANAGEMENT 1 MG/0.5ML ~~LOC~~ SOAJ
1.0000 mg | SUBCUTANEOUS | 1 refills | Status: AC
  Filled 2023-02-22: qty 2, fill #0

## 2023-02-22 NOTE — Patient Instructions (Addendum)
Medication Instructions:  START WEGOVY 0.25 MG INJECT WEEKLY ONCE YOU FIND OUT IF INSURANCE APPROVED   *If you need a refill on your cardiac medications before your next appointment, please call your pharmacy*  Lab Work: NONE.  Testing/Procedures: NONE  Follow-Up: At [facility], you and your health needs are our priority.  As part of our continuing mission to provide you with exceptional heart care, we have created designated Provider Care Teams.  These Care Teams include your primary Cardiologist (physician) and Advanced Practice Providers (APPs -  Physician Assistants and Nurse Practitioners) who all work together to provide you with the care you need, when you need it.  We recommend signing up for the patient portal called "MyChart".  Sign up information is provided on this After Visit Summary.  MyChart is used to connect with patients for Virtual Visits (Telemedicine).  Patients are able to view lab/test results, encounter notes, upcoming appointments, etc.  Non-urgent messages can be sent to your provider as well.   To learn more about what you can do with MyChart, go to [email or web address].    Your next appointment:   3 month(s)  Provider:   Buford Dresser, MD

## 2023-02-22 NOTE — Progress Notes (Signed)
Cardiology Office Note:    Date:  02/22/2023   ID:  Michelle Strickland, DOB 08-Apr-1986, MRN [id number]  PCP:  Eben Burow, NP  Cardiologist:  Buford Dresser, MD  CC: follow up  History of Present Illness:    Michelle Strickland is a 37 y.o. adult with a hx of nonischemic cardiomyopathy, chronic systolic and diastolic heart failure, COPD, asthma, depression, PTSD, schizophrenia, and morbid obesity, who is seen for follow-up.   He has had multiple admissions in the past. ED and hospital notes personally reviewed.   On 07/31/2022 he was admitted with concerns for chest pain. Troponin levels were abnormal but flat trend. EKG nonischemic. Echocardiogram showed EF of 45% with global hypokinesis. D-dimer was noted to be normal. LDL 104. Cardiology recommended coronary CTA which showed no significant disease. Cardiology recommended low-dose beta-blocker and ARB which were initiated. No aspirin or statin, planned to discuss this further at outpatient follow-up.  He was then admitted 08/06/2022 with complaints of progressively worse dyspnea, pleuritic chest pain, orthopnea, PND and lower extremity edema since leaving the hospital. He was given IV lasix. Unremarkable D-dimer. Troponin was 47 then 38 ng/L. BNP 734 pg/mL. Two-view chest radiograph showed cardiomegaly with pulmonary venous congestion, but no frank pulmonary edema. Discharged with PO lasix 40 mg.  On 08/17/2022 he presented to the ED complaining of shortness of breath and chest pain. He noted decreased urination and increased weight. Chest x-ray was clear and BNP improved to 270. Of note, when the nurse tried to ambulate the patient, his oxygen remained normal but his heart rate dropped down to the 30s. There was consideration for symptomatic bradycardia. He was admitted for telemetry monitoring. His EKG showed sinus rhythm at 56 bpm, considered right atrial enlargement, right axis deviation. Discharged 08/18/22.  At his last  appointment, he reported his weight had been stable around 286 lbs at home. He was frustrated with weight gain despite focusing on eating smaller portions. He noted that he was not urinating adequate amounts which he felt was contributory. His presyncopal episodes were improving; he only felt that way with periods of severe coughing. He was euvolemic on exam. After discussion he was amenable to starting [place]. However, his insurance would not cover [place].  Today, he has been feeling stressed. Lately he has been experiencing "stretchy" chest pains which he attributes to his stress. These pains occur daily, but he stresses all day every day. While active at work he denies being limited by any symptoms.   He has lost some weight, currently 264 lbs. 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  He denies any palpitations, shortness of breath, or peripheral edema. No lightheadedness, headaches, syncope, orthopnea, or PND.    Past Medical History:  Diagnosis Date   Asthma    Bronchitis    Chronic diarrhea    COPD (chronic obstructive pulmonary disease) (HCC)    Depression    Environmental and seasonal allergies    Family history of adverse reaction to anesthesia    " My sister vomited and aspirated during surgery; they gave her too much anesthesia"   Fatty liver    H/O heartburn    Morbid obesity ([place])    Osteoarthritis of right acromioclavicular joint    PTSD (post-traumatic stress disorder)    Schizophrenia ([place])    Tendinosis of rotator cuff    impingement   Wears glasses     Past Surgical History:  Procedure Laterality Date   CHOLECYSTECTOMY     COLONOSCOPY     SHOULDER ARTHROSCOPY Right 01/31/2019   Procedure: RIGHT SHOULDER ARTHROSCOPIC DEBRIDEMENT ROTATOR CUFF; SUBACROMIAL DECOMPRESSION; DISTAL CLAVICLE EXCISION;  Surgeon: Tania Ade, MD;  Location: [facility];  Service: Orthopedics;  Laterality: Right;   WISDOM TOOTH  EXTRACTION      Current Medications: Current Outpatient Medications on File Prior to Visit  Medication Sig   ARIPiprazole (ABILIFY) 2 MG tablet Take 2.5 mg by mouth daily.   busPIRone (BUSPAR) 5 MG tablet Take 7.5 mg by mouth daily.   losartan (COZAAR) 25 MG tablet Take 0.5 tablets (12.5 mg total) by mouth daily.   metoprolol succinate (TOPROL-XL) 25 MG 24 hr tablet Take 0.5 tablets (12.5 mg total) by mouth daily.   testosterone enanthate (DELATESTRYL) 200 MG/ML injection Inject 200 mg into the muscle every 7 (seven) days. For IM use only   torsemide (DEMADEX) 20 MG tablet Take 1 tablet (20 mg total) by mouth daily.   No current facility-administered medications on file prior to visit.     Allergies:   Contrast media [iodinated contrast media], Penicillins, Toradol [ketorolac tromethamine], Tramadol, Nsaids, and Watermelon [citrullus vulgaris]   Social History   Tobacco Use   Smoking status: Never   Smokeless tobacco: Never  Vaping Use   Vaping Use: Never used  Substance Use Topics   Alcohol use: Yes    Comment: occasional   Drug use: Not Currently    Family History: family history includes Heart disease in his father and paternal grandfather.  ROS:   Please see the history of present illness. (+) Chest pains (+) Stress All other systems are reviewed and negative.    EKGs/Labs/Other Studies Reviewed:    The following studies were reviewed today:  Coronary CTA  08/02/2022: IMPRESSION: 1. No evidence of CAD, CADRADS = 0.   2. Coronary calcium score of 0. This was 0 percentile for age and sex matched control.   3. Normal coronary origin with right dominance.   4. Dilated size of the pulmonary artery, measuring 35 mm.  Echo  08/01/2022: Sonographer Comments: Technically difficult study due to poor echo windows and patient is morbidly obese. Image acquisition challenging due to patient body habitus. Delay sending study   IMPRESSIONS   1. Left ventricular ejection  fraction by 3D volume is 45 %. The left  ventricle has mildly decreased function. The left ventricle demonstrates  global hypokinesis. There is mild left ventricular hypertrophy. Left  ventricular diastolic parameters are  consistent with Grade II diastolic dysfunction (pseudonormalization).   2. Right ventricular systolic function is normal. The right ventricular  size is normal.   3. Left atrial size was mildly dilated.   4. The mitral valve is normal in structure. Trivial mitral valve  regurgitation. No evidence of mitral stenosis.   5. The aortic valve is tricuspid. Aortic valve regurgitation is not  visualized. No aortic stenosis is present.   6. The inferior vena cava is dilated in size with <50% respiratory  variability, suggesting right atrial pressure of 15 mmHg.   EKG:  EKG is personally reviewed.   02/22/2023:  NSR at 66 bpm 09/27/2022: not ordered 09/01/2022:  not ordered  Recent Labs: 07/31/2022: TSH 2.306 08/17/2022: B Natriuretic Peptide 265.5 08/18/2022: ALT 62; BUN 18; Creatinine, Ser 1.09; Hemoglobin 15.6; Magnesium 2.1; Platelets 208; Potassium 4.1; Sodium 137   Recent Lipid Panel    Component Value Date/Time   CHOL 153 08/01/2022 1458   TRIG  69 08/01/2022 1458   HDL 35 (L) 08/01/2022 1458   CHOLHDL 4.4 08/01/2022 1458   VLDL 14 08/01/2022 1458   LDLCALC 104 (H) 08/01/2022 1458    Physical Exam:    VS:  BP (!) 136/98 (BP Location: Left Arm, Patient Position: Sitting, Cuff Size: Large)   Pulse 66   Ht '5\' 6"'$  (1.676 m)   Wt 264 lb (119.7 kg)   BMI 42.61 kg/m     Wt Readings from Last 3 Encounters:  02/22/23 264 lb (119.7 kg)  09/27/22 295 lb 9.6 oz (134.1 kg)  09/27/22 280 lb (127 kg)    GEN: Well nourished, well developed in no acute distress HEENT: Normal, moist mucous membranes NECK: No JVD CARDIAC: regular rhythm, normal S1 and S2, no rubs or gallops. No murmur. VASCULAR: Radial and DP pulses 2+ bilaterally. No carotid bruits RESPIRATORY:  Clear to  auscultation without rales, wheezing or rhonchi  ABDOMEN: Soft, non-tender, non-distended MUSCULOSKELETAL:  Ambulates independently SKIN: Warm and dry, no edema NEUROLOGIC:  Alert and oriented x 3. No focal neuro deficits noted. PSYCHIATRIC:  Normal affect    ASSESSMENT:    1. Nonischemic cardiomyopathy (HCC)   2. Class 3 severe obesity due to excess calories with serious comorbidity and body mass index (BMI) of 40.0 to 44.9 in adult ([place])   3. Atypical chest pain   4. Exercise counseling   5. Nutritional counseling     PLAN:    Chest pain, intermittent -evaluated with CCTA while admitted, not ischemic -this occurs with high stress situations. Suspect sympathetic component to is -reviewed red flag warning signs that need immediate medical attention  Nonischemic cardiomyopathy Obesity, BMI 47->42 -he is euvolemic on exam -I do not think he has symptomatic bradycardia, see prior notes -continue metoprolol, losartan -discussed SGLT2i. He would like to try [place] first, then consider SGTL2i -Given weight, obesity, we discussed GLP1RA today; we discussed previously, but it was not covered on insurance. He now has different insurance, wants to try again  Cardiac risk counseling and prevention recommendations: -recommend heart healthy/Mediterranean diet, with whole grains, fruits, vegetable, fish, lean meats, nuts, and olive oil. Limit salt. -recommend moderate walking, 3-5 times/week for 30-50 minutes each session. Aim for at least 150 minutes.week. Goal should be pace of 3 miles/hours, or walking 1.5 miles in 30 minutes -recommend avoidance of tobacco products. Avoid excess alcohol.  Plan for follow up: 3 months, or sooner as needed.  Buford Dresser, MD, PhD, [facility] at [facility] [address], [place], [place] [address] [phone]   Medication Adjustments/Labs  and Tests Ordered: Current medicines are reviewed at length with the patient today.  Concerns regarding medicines are outlined above.   Orders Placed This Encounter  Procedures   EKG 12-Lead   Meds ordered this encounter  Medications   Semaglutide-Weight Management 0.5 MG/0.5ML SOAJ    Sig: Inject 0.5 mg into the skin once a week.    Dispense:  2 mL    Refill:  1   Semaglutide-Weight Management 1 MG/0.5ML SOAJ    Sig: Inject 1 mg into the skin once a week.    Dispense:  2 mL    Refill:  1   Semaglutide-Weight Management 1.7 MG/0.75ML SOAJ    Sig: Inject 1.7 mg into the skin once a week.    Dispense:  3 mL    Refill:  1   Semaglutide-Weight  Management 2.4 MG/0.75ML SOAJ    Sig: Inject 2.4 mg into the skin once a week.    Dispense:  3 mL    Refill:  1   Semaglutide-Weight Management (WEGOVY) 0.25 MG/0.5ML SOAJ    Sig: Inject 0.25 mg into the skin once a week. FOR 4 WEEKS    Dispense:  1 mL    Refill:  0    Order Specific Question:   Lot Number?    Answer:   [id number]    Order Specific Question:   Expiration Date?    Answer:   10/26/2023    Order Specific Question:   Quantity    Answer:   1    Comments:   box   Patient Instructions  Medication Instructions:  START WEGOVY 0.25 MG INJECT WEEKLY ONCE YOU FIND OUT IF INSURANCE APPROVED   *If you need a refill on your cardiac medications before your next appointment, please call your pharmacy*  Lab Work: NONE.  Testing/Procedures: NONE  Follow-Up: At [facility], you and your health needs are our priority.  As part of our continuing mission to provide you with exceptional heart care, we have created designated Provider Care Teams.  These Care Teams include your primary Cardiologist (physician) and Advanced Practice Providers (APPs -  Physician Assistants and Nurse Practitioners) who all work together to provide you with the care you need, when you need it.  We recommend signing up for the patient portal called  "MyChart".  Sign up information is provided on this After Visit Summary.  MyChart is used to connect with patients for Virtual Visits (Telemedicine).  Patients are able to view lab/test results, encounter notes, upcoming appointments, etc.  Non-urgent messages can be sent to your provider as well.   To learn more about what you can do with MyChart, go to [email or web address].    Your next appointment:   3 month(s)  Provider:   Buford Dresser, MD       [facility],acting as a scribe for Buford Dresser, MD.,have documented all relevant documentation on the behalf of Buford Dresser, MD,as directed by  Buford Dresser, MD while in the presence of Buford Dresser, MD.  I, Buford Dresser, MD, have reviewed all documentation for this visit. The documentation on 02/22/23 for the exam, diagnosis, procedures, and orders are all accurate and complete.   Signed, Buford Dresser, MD PhD 02/22/2023     [place]

## 2023-02-27 ENCOUNTER — Encounter: Payer: Self-pay | Admitting: Pharmacist

## 2023-02-27 NOTE — Telephone Encounter (Signed)
This encounter was created in error - please disregard.

## 2023-03-01 ENCOUNTER — Telehealth: Payer: Self-pay

## 2023-03-01 ENCOUNTER — Other Ambulatory Visit (HOSPITAL_COMMUNITY): Payer: Self-pay

## 2023-03-01 NOTE — Telephone Encounter (Signed)
Pharmacy Patient Advocate Encounter   Received notification from [facility] that prior authorization for [facility] is needed.    PA submitted on 03/01/23 Status is pending  Karie Soda, [place] Patient Advocate Specialist Direct Number: [phone] Fax: [phone]

## 2023-03-03 NOTE — Telephone Encounter (Signed)
Pharmacy Patient Advocate Encounter  [facility]ization for [facility]   has been approved.    Effective dates: 03/01/23 through 07/05/23  Karie Soda, [place] Patient Advocate Specialist Direct Number: [phone] Fax: [phone]

## 2023-03-04 ENCOUNTER — Other Ambulatory Visit (HOSPITAL_BASED_OUTPATIENT_CLINIC_OR_DEPARTMENT_OTHER): Payer: Self-pay

## 2023-03-06 ENCOUNTER — Other Ambulatory Visit (HOSPITAL_BASED_OUTPATIENT_CLINIC_OR_DEPARTMENT_OTHER): Payer: Self-pay

## 2023-03-08 ENCOUNTER — Telehealth: Payer: 59 | Admitting: Physician Assistant

## 2023-03-08 DIAGNOSIS — R42 Dizziness and giddiness: Secondary | ICD-10-CM | POA: Diagnosis not present

## 2023-03-08 MED ORDER — MECLIZINE HCL 25 MG PO TABS: 25.0000 mg | ORAL_TABLET | Freq: Three times a day (TID) | ORAL | 0 refills | Status: DC | PRN

## 2023-03-08 NOTE — Patient Instructions (Signed)
Starlyn Skeans, thank you for joining Mar Daring, PA-C for today's virtual visit.  While this provider is not your primary care provider (PCP), if your PCP is located in our provider database this encounter information will be shared with them immediately following your visit.   [facility] account gives you access to today's visit and all your visits, tests, and labs performed at [facility] " click here if you don't have a [facility] account or go to [email or web address]  Consent: (Patient) Michelle Strickland provided verbal consent for this virtual visit at the beginning of the encounter.  Current Medications:  Current Outpatient Medications:    meclizine (ANTIVERT) 25 MG tablet, Take 1 tablet (25 mg total) by mouth 3 (three) times daily as needed for dizziness., Disp: 30 tablet, Rfl: 0   ARIPiprazole (ABILIFY) 2 MG tablet, Take 2.5 mg by mouth daily., Disp: , Rfl:    busPIRone (BUSPAR) 5 MG tablet, Take 7.5 mg by mouth daily., Disp: , Rfl:    losartan (COZAAR) 25 MG tablet, Take 0.5 tablets (12.5 mg total) by mouth daily., Disp: 45 tablet, Rfl: 3   metoprolol succinate (TOPROL-XL) 25 MG 24 hr tablet, Take 0.5 tablets (12.5 mg total) by mouth daily., Disp: 45 tablet, Rfl: 3   Semaglutide-Weight Management (WEGOVY) 0.25 MG/0.5ML SOAJ, Inject 0.25 mg into the skin once a week. FOR 4 WEEKS, Disp: 1 mL, Rfl: 0   [START ON 03/23/2023] Semaglutide-Weight Management 0.5 MG/0.5ML SOAJ, Inject 0.5 mg into the skin once a week., Disp: 2 mL, Rfl: 1   [START ON 04/21/2023] Semaglutide-Weight Management 1 MG/0.5ML SOAJ, Inject 1 mg into the skin once a week., Disp: 2 mL, Rfl: 1   [START ON 05/20/2023] Semaglutide-Weight Management 1.7 MG/0.75ML SOAJ, Inject 1.7 mg into the skin once a week., Disp: 3 mL, Rfl: 1   [START ON 06/18/2023] Semaglutide-Weight Management 2.4 MG/0.75ML SOAJ, Inject 2.4 mg into the skin once a week., Disp: 3 mL, Rfl: 1   testosterone  enanthate (DELATESTRYL) 200 MG/ML injection, Inject 200 mg into the muscle every 7 (seven) days. For IM use only, Disp: , Rfl:    torsemide (DEMADEX) 20 MG tablet, Take 1 tablet (20 mg total) by mouth daily., Disp: 90 tablet, Rfl: 3   Medications ordered in this encounter:  Meds ordered this encounter  Medications   meclizine (ANTIVERT) 25 MG tablet    Sig: Take 1 tablet (25 mg total) by mouth 3 (three) times daily as needed for dizziness.    Dispense:  30 tablet    Refill:  0    Order Specific Question:   Supervising Provider    Answer:   Chase Picket [id number]     *If you need refills on other medications prior to your next appointment, please contact your pharmacy*  Follow-Up: Call back or seek an in-person evaluation if the symptoms worsen or if the condition fails to improve as anticipated.  [place] [phone]  Other Instructions  Vertigo Vertigo is the feeling that you or your surroundings are moving when they are not. This feeling can come and go at any time. Vertigo often goes away on its own. Vertigo can be dangerous if it occurs while you are doing something that could endanger yourself or others, such as driving or operating machinery. Your health care provider will do tests to try to determine the cause of your vertigo. Tests will also help your health care provider decide how best  to treat your condition. Follow these instructions at home: Eating and drinking     Dehydration can make vertigo worse. Drink enough fluid to keep your urine pale yellow. Do not drink alcohol. Activity Return to your normal activities as told by your health care provider. Ask your health care provider what activities are safe for you. In the morning, first sit up on the side of the bed. When you feel okay, stand slowly while you hold onto something until you know that your balance is fine. Move slowly. Avoid sudden body or head movements or certain positions, as  told by your health care provider. If you have trouble walking or keeping your balance, try using a cane for stability. If you feel dizzy or unstable, sit down right away. Avoid doing any tasks that would cause danger to you or others if vertigo occurs. Avoid bending down if you feel dizzy. Place items in your home so that they are easy for you to reach without bending or leaning over. Do not drive or use machinery if you feel dizzy. General instructions Take over-the-counter and prescription medicines only as told by your health care provider. Keep all follow-up visits. This is important. Contact a health care provider if: Your medicines do not relieve your vertigo or they make it worse. Your condition gets worse or you develop new symptoms. You have a fever. You develop nausea or vomiting, or if nausea gets worse. Your family or friends notice any behavioral changes. You have numbness or a prickling and tingling sensation in part of your body. Get help right away if you: Are always dizzy or you faint. Develop severe headaches. Develop a stiff neck. Develop sensitivity to light. Have difficulty moving or speaking. Have weakness in your hands, arms, or legs. Have changes in your hearing or vision. These symptoms may represent a serious problem that is an emergency. Do not wait to see if the symptoms will go away. Get medical help right away. Call your local emergency services (911 in the [place] Do not drive yourself to the hospital. Summary Vertigo is the feeling that you or your surroundings are moving when they are not. Your health care provider will do tests to try to determine the cause of your vertigo. Follow instructions for home care. You may be told to avoid certain tasks, positions, or movements. Contact a health care provider if your medicines do not relieve your symptoms, or if you have a fever, nausea, vomiting, or changes in behavior. Get help right away if you have severe  headaches or difficulty speaking, or you develop hearing or vision problems. This information is not intended to replace advice given to you by your health care provider. Make sure you discuss any questions you have with your health care provider. Document Revised: 11/11/2020 Document Reviewed: 11/11/2020 Elsevier Patient Education  [place].   How to Perform the Epley Maneuver The Epley maneuver is an exercise that relieves symptoms of vertigo. Vertigo is the feeling that you or your surroundings are moving when they are not. When you feel vertigo, you may feel like the room is spinning and may have trouble walking. The Epley maneuver is used for a type of vertigo caused by a calcium deposit in a part of the inner ear. The maneuver involves changing head positions to help the deposit move out of the area. You can do this maneuver at home whenever you have symptoms of vertigo. You can repeat it in 24 hours if your  vertigo has not gone away. Even though the Epley maneuver may relieve your vertigo for a few weeks, it is possible that your symptoms will return. This maneuver relieves vertigo, but it does not relieve dizziness. What are the risks? If it is done correctly, the Epley maneuver is considered safe. Sometimes it can lead to dizziness or nausea that goes away after a short time. If you develop other symptoms--such as changes in vision, weakness, or numbness--stop doing the maneuver and call your health care provider. Supplies needed: A bed or table. A pillow. How to do the Epley maneuver     Sit on the edge of a bed or table with your back straight and your legs extended or hanging over the edge of the bed or table. Turn your head halfway toward the affected ear or side as told by your health care provider. Lie backward quickly with your head turned until you are lying flat on your back. Your head should dangle (head-hanging position). You may want to position a pillow under your  shoulders. Hold this position for at least 30 seconds. If you feel dizzy or have symptoms of vertigo, continue to hold the position until the symptoms stop. Turn your head to the opposite direction until your unaffected ear is facing down. Your head should continue to dangle. Hold this position for at least 30 seconds. If you feel dizzy or have symptoms of vertigo, continue to hold the position until the symptoms stop. Turn your whole body to the same side as your head so that you are positioned on your side. Your head will now be nearly facedown and no longer needs to dangle. Hold for at least 30 seconds. If you feel dizzy or have symptoms of vertigo, continue to hold the position until the symptoms stop. Sit back up. You can repeat the maneuver in 24 hours if your vertigo does not go away. Follow these instructions at home: For 24 hours after doing the Epley maneuver: Keep your head in an upright position. When lying down to sleep or rest, keep your head raised (elevated) with two or more pillows. Avoid excessive neck movements. Activity Do not drive or use machinery if you feel dizzy. After doing the Epley maneuver, return to your normal activities as told by your health care provider. Ask your health care provider what activities are safe for you. General instructions Drink enough fluid to keep your urine pale yellow. Do not drink alcohol. Take over-the-counter and prescription medicines only as told by your health care provider. Keep all follow-up visits. This is important. Preventing vertigo symptoms Ask your health care provider if there is anything you should do at home to prevent vertigo. He or she may recommend that you: Keep your head elevated with two or more pillows while you sleep. Do not sleep on the side of your affected ear. Get up slowly from bed. Avoid sudden movements during the day. Avoid extreme head positions or movement, such as looking up or bending over. Contact a  health care provider if: Your vertigo gets worse. You have other symptoms, including: Nausea. Vomiting. Headache. Get help right away if you: Have vision changes. Have a headache or neck pain that is severe or getting worse. Cannot stop vomiting. Have new numbness or weakness in any part of your body. These symptoms may represent a serious problem that is an emergency. Do not wait to see if the symptoms will go away. Get medical help right away. Call your local emergency  services (911 in the [place] Do not drive yourself to the hospital. Summary Vertigo is the feeling that you or your surroundings are moving when they are not. The Epley maneuver is an exercise that relieves symptoms of vertigo. If the Epley maneuver is done correctly, it is considered safe. This information is not intended to replace advice given to you by your health care provider. Make sure you discuss any questions you have with your health care provider. Document Revised: 11/11/2020 Document Reviewed: 11/11/2020 Elsevier Patient Education  [place] Exercise for Vertigo  The Brandt-Daroff exercise is one of several exercises that can speed up the compensation process and end the symptoms of vertigo. It often is prescribed for people who have benign paroxysmal positional vertigo (BPPV) and sometimes for labyrinthitis. These exercises won't cure these conditions. But over time they can reduce symptoms of vertigo.  People who use this exercise usually are told to do several repetitions of the exercise at least twice a day.  How It Is Done To do the Brandt-Daroff exercise:  Start in an upright, seated position. Move into the lying position on one side with your nose pointed up at about a 45-degree angle. Remain in this position for about 30 seconds (or until the vertigo subsides, whichever is longer), then move back to the seated position. Repeat steps 2 and 3 on the other side. What To  Expect Symptoms sometimes suddenly go away during an exercise period. More often, improvement occurs gradually over a period of weeks or months.  Why It Is Done The Brandt-Daroff exercise and other similar exercises are used to treat BPPV. These exercises are sometimes used to treat labyrinthitis or vestibular neuritis.  How Well It Works These exercises can help your body get used to the confusing signals that are causing your vertigo. This may help you get over your vertigo sooner.  The Brandt-Daroff exercise does not help relieve the symptoms of benign paroxysmal positional vertigo (BPPV) as well as the [place] maneuver or the Epley maneuver.footnote1  Risks There are no risks in doing these exercises. To avoid hitting your head or developing minor neck injuries, be careful not to lie down too quickly.  References Citations Fife TD, et al. (2008). Practice parameter: Therapies for benign paroxysmal positional vertigo (an evidence-based review). Report of the Quality Standards Subcommittee of the [place] [employer] Neu[employer]. Neurology, 70(22): [phone]. Current as of: Apr 28, 2021    If you have been instructed to have an in-person evaluation today at a local Urgent Care facility, please use the link below. It will take you to a list of all of our available [facility], including address, phone number and hours of operation. Please do not delay care.  [facility]  If you or a family member do not have a primary care provider, use the link below to schedule a visit and establish care. When you choose a [facility] primary care physician or advanced practice provider, you gain a long-term partner in health. Find a Primary Care Provider  Learn more about [facility] in-office and virtual care options: [facility]

## 2023-03-08 NOTE — Progress Notes (Addendum)
Virtual Visit Consent   Michelle Strickland, you are scheduled for a virtual visit with a [facility] provider today. Just as with appointments in the office, your consent must be obtained to participate. Your consent will be active for this visit and any virtual visit you may have with one of our providers in the next 365 days. If you have a MyChart account, a copy of this consent can be sent to you electronically.  As this is a virtual visit, video technology does not allow for your provider to perform a traditional examination. This may limit your provider's ability to fully assess your condition. If your provider identifies any concerns that need to be evaluated in person or the need to arrange testing (such as labs, EKG, etc.), we will make arrangements to do so. Although advances in technology are sophisticated, we cannot ensure that it will always work on either your end or our end. If the connection with a video visit is poor, the visit may have to be switched to a telephone visit. With either a video or telephone visit, we are not always able to ensure that we have a secure connection.  By engaging in this virtual visit, you consent to the provision of healthcare and authorize for your insurance to be billed (if applicable) for the services provided during this visit. Depending on your insurance coverage, you may receive a charge related to this service.  I need to obtain your verbal consent now. Are you willing to proceed with your visit today? ZAELEIGH BOK has provided verbal consent on 03/08/2023 for a virtual visit (video or telephone). Mar Daring, PA-C  Date: 03/08/2023 1:57 PM  Virtual Visit via Video Note   I, Mar Daring, connected with  Michelle Strickland  ([id number], 12/16/1986) on 03/08/23 at  1:45 PM EDT by a video-enabled telemedicine application and verified that I am speaking with the correct person using two identifiers.  Location: Patient: Virtual  Visit Location Patient: Home Provider: Virtual Visit Location Provider: Home Office   I discussed the limitations of evaluation and management by telemedicine and the availability of in person appointments. The patient expressed understanding and agreed to proceed.    History of Present Illness: Michelle Strickland is a 37 y.o. who identifies as a female who was assigned adult at birth, and is being seen today for dizziness.  HPI: Dizziness This is a new problem. The current episode started today. The problem has been unchanged. Associated symptoms include fatigue, vertigo and a visual change (blurred and some double during spell). Pertinent negatives include no anorexia, chest pain, chills, congestion, coughing, diaphoresis, fever, headaches, nausea, numbness, vomiting or weakness. Associated symptoms comments: Nystagmus feeling bialteral, felt like they were moving, sob. Exacerbated by: movement. He has tried rest for the symptoms. The treatment provided mild relief.   Recently had [facility] approved but has not started. Felt different than when he had symptomatic bradycardia last year.  Problems:  Patient Active Problem List   Diagnosis Date Noted   Acute CHF (congestive heart failure) ([place]) 08/17/2022   Acute on chronic combined systolic and diastolic heart failure ([place]) 08/06/2022   Class 3 obesity ([place]) 08/06/2022   Chest pain 07/31/2022   Elevated troponin 07/31/2022   Elevated BP without diagnosis of hypertension 07/31/2022   Headache 07/31/2022   Alcohol abuse 07/31/2022   Hypophosphatemia 07/31/2022   Mild intermittent asthma with acute exacerbation 07/21/2021   Viral hepatitis A without hepatic coma 11/18/2018  Severe episode of recurrent major depressive disorder, without psychotic features ([place]) 11/08/2018   Hirsutism 11/08/2018   Fatty liver 08/17/2017   Schizoaffective disorder, bipolar type ([place]) 02/14/2017   Posttraumatic stress disorder 10/01/2014   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 No Did it involve sudden or severe rash/hives, skin peeling, or any reaction on the inside of your mouth or nose? Yes Did you need to seek medical attention at a hospital or doctor's office? Yes When did it last happen?      Childhood allergy If all above answers are "NO", may proceed with cephalosporin use.    Toradol [Ketorolac Tromethamine] Nausea Only and Other (See Comments)    migraines   Tramadol Other (See Comments)    migraines   Nsaids    Watermelon [Citrullus Vulgaris] Other (See Comments)    Throat itches   Medications:  Current Outpatient Medications:    meclizine (ANTIVERT) 25 MG tablet, Take 1 tablet (25 mg total) by mouth 3 (three) times daily as needed for dizziness., Disp: 30 tablet, Rfl: 0   ARIPiprazole (ABILIFY) 2 MG tablet, Take 2.5 mg by mouth daily., Disp: , Rfl:    busPIRone (BUSPAR) 5 MG tablet, Take 7.5 mg by mouth daily., Disp: , Rfl:    losartan (COZAAR) 25 MG tablet, Take 0.5 tablets (12.5 mg total) by mouth daily., Disp: 45 tablet, Rfl: 3   metoprolol succinate (TOPROL-XL) 25 MG 24 hr tablet, Take 0.5 tablets (12.5 mg total) by mouth daily., Disp: 45 tablet, Rfl: 3   Semaglutide-Weight Management (WEGOVY) 0.25 MG/0.5ML SOAJ, Inject 0.25 mg into the skin once a week. FOR 4 WEEKS, Disp: 1 mL, Rfl: 0   [START ON 03/23/2023] Semaglutide-Weight Management 0.5 MG/0.5ML SOAJ, Inject 0.5 mg into the skin once a week., Disp: 2 mL, Rfl: 1   [START ON 04/21/2023] Semaglutide-Weight Management 1 MG/0.5ML SOAJ, Inject 1 mg into the skin once a week., Disp: 2 mL, Rfl: 1    [START ON 05/20/2023] Semaglutide-Weight Management 1.7 MG/0.75ML SOAJ, Inject 1.7 mg into the skin once a week., Disp: 3 mL, Rfl: 1   [START ON 06/18/2023] Semaglutide-Weight Management 2.4 MG/0.75ML SOAJ, Inject 2.4 mg into the skin once a week., Disp: 3 mL, Rfl: 1   testosterone enanthate (DELATESTRYL) 200 MG/ML injection, Inject 200 mg into the muscle every 7 (seven) days. For IM use only, Disp: , Rfl:    torsemide (DEMADEX) 20 MG tablet, Take 1 tablet (20 mg total) by mouth daily., Disp: 90 tablet, Rfl: 3  Observations/Objective: Patient is well-developed, well-nourished in no acute distress.  Resting comfortably at home.  Head is normocephalic, atraumatic.  No labored breathing.  Speech is clear and coherent with logical content.  Patient is alert and oriented at baseline.    Assessment and Plan: 1. Vertigo - meclizine (ANTIVERT) 25 MG tablet; Take 1 tablet (25 mg total) by mouth 3 (three) times daily as needed for dizziness.  Dispense: 30 tablet; Refill: 0  - Suspected vertigo, possibly BPPV, but has been slightly more severe and persistent - Meclizine prescribed (drowsiness precautions discussed) - Epley and Nestor Lewandowsky exercises sent via AVS -  Advised if symptoms worsen to seek in person evaluation  - Work note provided  Follow Up Instructions: I discussed the assessment and treatment plan with the patient. The patient was provided an opportunity to ask questions and all were answered. The patient agreed with the plan and demonstrated an understanding of the instructions.  A copy of instructions were sent to the patient via MyChart unless otherwise noted below.    The patient was advised to call back or seek an in-person evaluation if the symptoms worsen or if the condition fails to improve as anticipated.  Time:  I spent 12 minutes with the patient via telehealth technology discussing the above problems/concerns.    Mar Daring, PA-C

## 2023-03-09 ENCOUNTER — Encounter: Payer: Self-pay | Admitting: Physician Assistant

## 2023-03-11 ENCOUNTER — Other Ambulatory Visit (HOSPITAL_BASED_OUTPATIENT_CLINIC_OR_DEPARTMENT_OTHER): Payer: Self-pay

## 2023-03-14 ENCOUNTER — Other Ambulatory Visit (HOSPITAL_BASED_OUTPATIENT_CLINIC_OR_DEPARTMENT_OTHER): Payer: Self-pay

## 2023-03-14 ENCOUNTER — Other Ambulatory Visit: Payer: Self-pay

## 2023-03-25 ENCOUNTER — Encounter (HOSPITAL_BASED_OUTPATIENT_CLINIC_OR_DEPARTMENT_OTHER): Payer: Self-pay

## 2023-03-27 ENCOUNTER — Other Ambulatory Visit (HOSPITAL_COMMUNITY): Payer: Self-pay

## 2023-03-27 NOTE — Telephone Encounter (Signed)
Hey looks like you worked on the original [facility] rx, having [place] also wouldn't change the price would it?

## 2023-03-27 NOTE — Telephone Encounter (Signed)
  Michelle Barrette! Michelle Strickland is plan/benefit exclusion on [employer] so they won't cover it with or without a PA. [employer] is the primary payer who has already approved this drug on PA so [facility] won't pick up the claim regardless. Patient has a deductible on [employer] they have to meet. Cost should come down once they meet deductible for the year.

## 2023-04-03 ENCOUNTER — Other Ambulatory Visit (HOSPITAL_BASED_OUTPATIENT_CLINIC_OR_DEPARTMENT_OTHER): Payer: Self-pay

## 2023-04-03 NOTE — Telephone Encounter (Signed)
Please advise for alternative. °

## 2023-04-06 ENCOUNTER — Other Ambulatory Visit (HOSPITAL_BASED_OUTPATIENT_CLINIC_OR_DEPARTMENT_OTHER): Payer: Self-pay

## 2023-04-06 ENCOUNTER — Other Ambulatory Visit: Payer: Self-pay

## 2023-04-07 ENCOUNTER — Other Ambulatory Visit (HOSPITAL_COMMUNITY): Payer: Self-pay

## 2023-04-07 ENCOUNTER — Other Ambulatory Visit (HOSPITAL_BASED_OUTPATIENT_CLINIC_OR_DEPARTMENT_OTHER): Payer: Self-pay

## 2023-04-07 NOTE — Telephone Encounter (Signed)
If wegovy isn't covered at all, we unfortunately do not have an alternative at this time. It's unfortunately been more and more common that insurance companies are not covering these medications.

## 2023-04-10 ENCOUNTER — Other Ambulatory Visit (HOSPITAL_BASED_OUTPATIENT_CLINIC_OR_DEPARTMENT_OTHER): Payer: Self-pay

## 2023-04-12 ENCOUNTER — Other Ambulatory Visit: Payer: Self-pay

## 2023-04-12 ENCOUNTER — Emergency Department (HOSPITAL_BASED_OUTPATIENT_CLINIC_OR_DEPARTMENT_OTHER): Payer: 59

## 2023-04-12 ENCOUNTER — Encounter (HOSPITAL_BASED_OUTPATIENT_CLINIC_OR_DEPARTMENT_OTHER): Payer: Self-pay

## 2023-04-12 ENCOUNTER — Emergency Department (HOSPITAL_BASED_OUTPATIENT_CLINIC_OR_DEPARTMENT_OTHER)
Admission: EM | Admit: 2023-04-12 | Discharge: 2023-04-12 | Discharge status: Against Medical Advice | Payer: 59 | Attending: Emergency Medicine | Admitting: Emergency Medicine

## 2023-04-12 DIAGNOSIS — Z5321 Procedure and treatment not carried out due to patient leaving prior to being seen by health care provider: Secondary | ICD-10-CM | POA: Diagnosis not present

## 2023-04-12 DIAGNOSIS — H538 Other visual disturbances: Secondary | ICD-10-CM | POA: Insufficient documentation

## 2023-04-12 DIAGNOSIS — R519 Headache, unspecified: Secondary | ICD-10-CM | POA: Diagnosis not present
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
## 2023-04-13 ENCOUNTER — Other Ambulatory Visit (HOSPITAL_BASED_OUTPATIENT_CLINIC_OR_DEPARTMENT_OTHER): Payer: Self-pay

## 2023-05-23 ENCOUNTER — Ambulatory Visit (HOSPITAL_BASED_OUTPATIENT_CLINIC_OR_DEPARTMENT_OTHER): Payer: 59 | Admitting: Cardiology

## 2023-05-26 DIAGNOSIS — F3181 Bipolar II disorder: Secondary | ICD-10-CM | POA: Diagnosis not present

## 2023-05-29 ENCOUNTER — Emergency Department (HOSPITAL_COMMUNITY)
Admission: EM | Admit: 2023-05-29 | Discharge: 2023-05-29 | Disposition: A | Discharge status: Home / Self Care | Payer: 59 | Attending: Emergency Medicine | Admitting: Emergency Medicine

## 2023-05-29 ENCOUNTER — Encounter (HOSPITAL_COMMUNITY): Payer: Self-pay

## 2023-05-29 ENCOUNTER — Emergency Department (HOSPITAL_COMMUNITY): Payer: 59

## 2023-05-29 ENCOUNTER — Other Ambulatory Visit: Payer: Self-pay

## 2023-05-29 DIAGNOSIS — Z794 Long term (current) use of insulin: Secondary | ICD-10-CM | POA: Insufficient documentation

## 2023-05-29 DIAGNOSIS — H538 Other visual disturbances: Secondary | ICD-10-CM | POA: Diagnosis not present

## 2023-05-29 DIAGNOSIS — R002 Palpitations: Secondary | ICD-10-CM | POA: Diagnosis not present

## 2023-05-29 DIAGNOSIS — I509 Heart failure, unspecified: Secondary | ICD-10-CM | POA: Diagnosis not present

## 2023-05-29 DIAGNOSIS — Z79899 Other long term (current) drug therapy: Secondary | ICD-10-CM | POA: Insufficient documentation

## 2023-05-29 DIAGNOSIS — I110 Hypertensive heart disease with heart failure: Secondary | ICD-10-CM | POA: Insufficient documentation

## 2023-05-29 DIAGNOSIS — R519 Headache, unspecified: Secondary | ICD-10-CM | POA: Diagnosis present

## 2023-05-29 LAB — CBC WITH DIFFERENTIAL/PLATELET
Abs Immature Granulocytes: 0.04 10*3/uL (ref 0.00–0.07)
Basophils Absolute: 0 10*3/uL (ref 0.0–0.1)
Basophils Relative: 1 %
Eosinophils Absolute: 0.1 10*3/uL (ref 0.0–0.5)
Eosinophils Relative: 1 %
HCT: 49.3 % — ABNORMAL HIGH (ref 36.0–46.0)
Hemoglobin: 16 g/dL — ABNORMAL HIGH (ref 12.0–15.0)
Immature Granulocytes: 1 %
Lymphocytes Relative: 27 %
Lymphs Abs: 1.6 10*3/uL (ref 0.7–4.0)
MCH: 30.7 pg (ref 26.0–34.0)
MCHC: 32.5 g/dL (ref 30.0–36.0)
MCV: 94.4 fL (ref 80.0–100.0)
Monocytes Absolute: 0.5 10*3/uL (ref 0.1–1.0)
Monocytes Relative: 9 %
Neutro Abs: 3.6 10*3/uL (ref 1.7–7.7)
Neutrophils Relative %: 61 %
Platelets: 231 10*3/uL (ref 150–400)
RBC: 5.22 MIL/uL — ABNORMAL HIGH (ref 3.87–5.11)
RDW: 13.3 % (ref 11.5–15.5)
WBC: 5.9 10*3/uL (ref 4.0–10.5)
nRBC: 0 % (ref 0.0–0.2)

## 2023-05-29 LAB — BASIC METABOLIC PANEL
Anion gap: 7 (ref 5–15)
BUN: 14 mg/dL (ref 6–20)
CO2: 27 mmol/L (ref 22–32)
Calcium: 9.2 mg/dL (ref 8.9–10.3)
Chloride: 104 mmol/L (ref 98–111)
Creatinine, Ser: 1.28 mg/dL — ABNORMAL HIGH (ref 0.44–1.00)
GFR, Estimated: 56 mL/min — ABNORMAL LOW (ref >60)
Glucose, Bld: 95 mg/dL (ref 70–99)
Potassium: 3.9 mmol/L (ref 3.5–5.1)
Sodium: 138 mmol/L (ref 135–145)

## 2023-05-29 MED ORDER — DIPHENHYDRAMINE HCL 50 MG/ML IJ SOLN
12.5000 mg | Freq: Once | INTRAMUSCULAR | Status: AC
  Administered 2023-05-29: 12.5 mg via INTRAVENOUS
  Filled 2023-05-29: qty 1

## 2023-05-29 MED ORDER — METOCLOPRAMIDE HCL 5 MG/ML IJ SOLN
10.0000 mg | Freq: Once | INTRAMUSCULAR | Status: AC
  Administered 2023-05-29: 10 mg via INTRAVENOUS
  Filled 2023-05-29: qty 2

## 2023-05-29 NOTE — ED Provider Notes (Signed)
[place] EMERGENCY DEPARTMENT AT [facility] Provider Note   CSN: [id number] Arrival date & time: 05/29/23  2113     History  Chief Complaint  Patient presents with   Hypertension   Headache    Michelle Strickland is a 37 y.o. adult presenting to the ED with headache, palpitations.  Patient reports that he was having a headache earlier today and checked his blood pressure at home and is quite elevated.  He took an additional dose of losartan.  He says since coming to the ED his symptoms have improved.  But he has felt jittery at times at home.  He has a history of congestive heart failure follows with cardiology, for which she takes metoprolol.  He denies any known history of diabetes.  He does not have a PCP.  He was seen in the emergency department approximately 1 month ago after reportedly striking his head or slamming his head into a door, which time he was having a headache and blurred vision.  He has had intermittent headaches ever since then.  HPI     Home Medications Prior to Admission medications   Medication Sig Start Date End Date Taking? Authorizing Provider  ARIPiprazole (ABILIFY) 2 MG tablet Take 2.5 mg by mouth daily. 09/09/22   [provider]  busPIRone (BUSPAR) 5 MG tablet Take 7.5 mg by mouth daily. 09/09/22   [provider]  losartan (COZAAR) 25 MG tablet Take 0.5 tablets (12.5 mg total) by mouth daily. 11/01/22   Jodelle Red, MD  meclizine (ANTIVERT) 25 MG tablet Take 1 tablet (25 mg total) by mouth 3 (three) times daily as needed for dizziness. 03/08/23   Margaretann Loveless, PA-C  metoprolol succinate (TOPROL-XL) 25 MG 24 hr tablet Take 0.5 tablets (12.5 mg total) by mouth daily. 11/01/22   Jodelle Red, MD  Semaglutide-Weight Management [facility]) 0.25 MG/0.5ML SOAJ Inject 0.25 mg into the skin once a week. FOR 4 WEEKS 02/22/23   Jodelle Red, MD  Semaglutide-Weight Management 1 MG/0.5ML SOAJ Inject 1 mg  into the skin once a week. 04/21/23 06/16/23  Jodelle Red, MD  Semaglutide-Weight Management 1.7 MG/0.75ML SOAJ Inject 1.7 mg into the skin once a week. 05/20/23 07/15/23  Jodelle Red, MD  Semaglutide-Weight Management 2.4 MG/0.75ML SOAJ Inject 2.4 mg into the skin once a week. 06/18/23 08/13/23  Jodelle Red, MD  testosterone enanthate (DELATESTRYL) 200 MG/ML injection Inject 200 mg into the muscle every 7 (seven) days. For IM use only    [provider]  torsemide (DEMADEX) 20 MG tablet Take 1 tablet (20 mg total) by mouth daily. 11/01/22 10/27/23  Jodelle Red, MD      Allergies    Contrast media [iodinated contrast media], Penicillins, Toradol [ketorolac tromethamine], Tramadol, Nsaids, and Watermelon [citrullus vulgaris]    Review of Systems   Review of Systems  Physical Exam Updated Vital Signs BP 122/82 (BP Location: Right Arm)   Pulse 60   Temp 98.3 F (36.8 C) (Oral)   Resp 17   Ht 5\' 6"  (1.676 m)   Wt 117.9 kg   SpO2 98%   BMI 41.97 kg/m  Physical Exam Constitutional:      General: He is not in acute distress.    Appearance: He is obese.  HENT:     Head: Normocephalic and atraumatic.  Eyes:     General: No visual field deficit.    Conjunctiva/sclera: Conjunctivae normal.     Pupils: Pupils are equal, round, and reactive to  light.  Cardiovascular:     Rate and Rhythm: Normal rate and regular rhythm.  Pulmonary:     Effort: Pulmonary effort is normal. No respiratory distress.  Abdominal:     General: There is no distension.     Tenderness: There is no abdominal tenderness.  Skin:    General: Skin is warm and dry.  Neurological:     General: No focal deficit present.     Mental Status: He is alert. Mental status is at baseline.     GCS: GCS eye subscore is 4. GCS verbal subscore is 5. GCS motor subscore is 6.     Cranial Nerves: No cranial nerve deficit, dysarthria or facial asymmetry.  Psychiatric:        Mood and  Affect: Mood normal.        Behavior: Behavior normal.     ED Results / Procedures / Treatments   Labs (all labs ordered are listed, but only abnormal results are displayed) Labs Reviewed  BASIC METABOLIC PANEL - Abnormal; Notable for the following components:      Result Value   Creatinine, Ser 1.28 (*)    GFR, Estimated 56 (*)    All other components within normal limits  CBC WITH DIFFERENTIAL/PLATELET - Abnormal; Notable for the following components:   RBC 5.22 (*)    Hemoglobin 16.0 (*)    HCT 49.3 (*)    All other components within normal limits    EKG EKG Interpretation  Date/Time:  Monday May 29 2023 21:46:12 EDT Ventricular Rate:  66 PR Interval:  129 QRS Duration: 102 QT Interval:  338 QTC Calculation: 354 R Axis:   115 Text Interpretation: Sinus rhythm Right atrial enlargement Right axis deviation Confirmed by Alvester Chou [phone]) on 05/29/2023 9:58:36 PM  Radiology CT Head Wo Contrast  Result Date: 05/29/2023 CLINICAL DATA:  Headache, blurred vision, nausea EXAM: CT HEAD WITHOUT CONTRAST TECHNIQUE: Contiguous axial images were obtained from the base of the skull through the vertex without intravenous contrast. RADIATION DOSE REDUCTION: This exam was performed according to the departmental dose-optimization program which includes automated exposure control, adjustment of the mA and/or kV according to patient size and/or use of iterative reconstruction technique. COMPARISON:  CT head 07/31/2022 FINDINGS: Brain: No intracranial hemorrhage, mass effect, or evidence of acute infarct. No hydrocephalus. No extra-axial fluid collection. Vascular: No hyperdense vessel or unexpected calcification. Skull: No fracture or focal lesion. Sinuses/Orbits: No acute finding. Paranasal sinuses and mastoid air cells are well aerated. Other: None. IMPRESSION: No acute intracranial process. Electronically Signed   By: Minerva Fester M.D.   On: 05/29/2023 22:51    Procedures Procedures     Medications Ordered in ED Medications  metoCLOPramide (REGLAN) injection 10 mg (10 mg Intravenous Given 05/29/23 2220)  diphenhydrAMINE (BENADRYL) injection 12.5 mg (12.5 mg Intravenous Given 05/29/23 2223)    ED Course/ Medical Decision Making/ A&P Clinical Course as of 05/29/23 2326  Mon May 29, 2023  2311 Patient reassessed and stable, no acute complaints.  He is okay for discharge at this time.  He will continue to search her PCP in the area for his chronic medical conditions or else follow-up with cardiologist.  His blood pressure is normalized. BP 122/80 mmhg on my last check in the room [MT]    Clinical Course User Index [MT] Kayliah Tindol, Kermit Balo, MD  Medical Decision Making Amount and/or Complexity of Data Reviewed Labs: ordered. Radiology: ordered.  Risk Prescription drug management.   This patient presents to the ED with concern for headache, high blood pressure. This involves an extensive number of treatment options, and is a complaint that carries with it a high risk of complications and morbidity.  The differential diagnosis includes tension headache versus complex migraine versus other  Patient does report trauma to the head approximately 1 month ago and has had persistent intermittent daily headaches since then.  I think is reasonable obtain CT imaging to ensure that there is no slowly developing subarachnoid bleed.  Low suspicion for subarachnoid hemorrhage  I ordered and personally interpreted labs.  The pertinent results include: No emergent findings.  Some chronic very mild kidney disease creatinine 1.2, labs are otherwise close to baseline  I ordered imaging studies including CT imaging of the brain I independently visualized and interpreted imaging which showed no emergent findings I agree with the radiologist interpretation  The patient was maintained on a cardiac monitor.  I personally viewed and interpreted the cardiac monitored  which showed an underlying rhythm of: Sinus rhythm  Per my interpretation the patient's ECG shows no acute ischemic findings  I ordered medication including IV Reglan and Benadryl for migraine  I have reviewed the patients home medicines and have made adjustments as needed  Test Considered: Doubt acute meningitis, no indication for LP.  Doubt ICH.  Doubt pulmonary embolism or ACS.   After the interventions noted above, I reevaluated the patient and found that they have: improved   Dispostion:  After consideration of the diagnostic results and the patients response to treatment, I feel that the patent would benefit from outpatient follow-up.         Final Clinical Impression(s) / ED Diagnoses Final diagnoses:  Nonintractable headache, unspecified chronicity pattern, unspecified headache type    Rx / DC Orders ED Discharge Orders     None         Terald Sleeper, MD 05/29/23 2326

## 2023-05-29 NOTE — ED Triage Notes (Signed)
Patient states he had a headache at lunch time, 1300. States at 1800 he checked his blood pressure at home (180/101) and the patient took 1/2 of his losartan pill. Noticed blurred vision around 2000, with nausea. Still reports a headache rated 6/10. Denies taking blood thinners. Takes losartan and meteorology for CHF. Denies tingling or numbness, or changes in speech. Patient ambulated to triage room with no difficulties.

## 2023-05-29 NOTE — Discharge Instructions (Signed)
You continue to look to establish care with a new primary care physician in the area.  A primary care provider can monitor your blood sugars, blood pressure, and your other medical conditions.

## 2023-05-30 ENCOUNTER — Other Ambulatory Visit (HOSPITAL_BASED_OUTPATIENT_CLINIC_OR_DEPARTMENT_OTHER): Payer: Self-pay

## 2023-05-30 ENCOUNTER — Other Ambulatory Visit: Payer: Self-pay

## 2023-05-31 ENCOUNTER — Other Ambulatory Visit (HOSPITAL_BASED_OUTPATIENT_CLINIC_OR_DEPARTMENT_OTHER): Payer: Self-pay

## 2023-06-02 ENCOUNTER — Other Ambulatory Visit (HOSPITAL_BASED_OUTPATIENT_CLINIC_OR_DEPARTMENT_OTHER): Payer: Self-pay

## 2023-07-25 DIAGNOSIS — F6381 Intermittent explosive disorder: Secondary | ICD-10-CM | POA: Diagnosis not present

## 2023-07-25 DIAGNOSIS — F39 Unspecified mood [affective] disorder: Secondary | ICD-10-CM | POA: Diagnosis not present

## 2023-07-25 DIAGNOSIS — F419 Anxiety disorder, unspecified: Secondary | ICD-10-CM | POA: Diagnosis not present

## 2023-10-17 ENCOUNTER — Encounter (HOSPITAL_BASED_OUTPATIENT_CLINIC_OR_DEPARTMENT_OTHER): Payer: Self-pay | Admitting: Cardiology

## 2023-10-17 ENCOUNTER — Ambulatory Visit (HOSPITAL_BASED_OUTPATIENT_CLINIC_OR_DEPARTMENT_OTHER): Payer: 59 | Admitting: Cardiology

## 2023-10-17 ENCOUNTER — Other Ambulatory Visit (HOSPITAL_BASED_OUTPATIENT_CLINIC_OR_DEPARTMENT_OTHER): Payer: Self-pay

## 2023-10-17 VITALS — BP 110/78 | HR 69 | Ht 66.0 in | Wt 281.0 lb

## 2023-10-17 DIAGNOSIS — Z6841 Body Mass Index (BMI) 40.0 and over, adult: Secondary | ICD-10-CM

## 2023-10-17 DIAGNOSIS — R400 Somnolence: Secondary | ICD-10-CM

## 2023-10-17 DIAGNOSIS — I5042 Chronic combined systolic (congestive) and diastolic (congestive) heart failure: Secondary | ICD-10-CM

## 2023-10-17 DIAGNOSIS — E66813 Obesity, class 3: Secondary | ICD-10-CM | POA: Diagnosis not present

## 2023-10-17 DIAGNOSIS — I428 Other cardiomyopathies: Secondary | ICD-10-CM | POA: Diagnosis not present

## 2023-10-17 DIAGNOSIS — R0683 Snoring: Secondary | ICD-10-CM

## 2023-10-17 MED ORDER — TORSEMIDE 20 MG PO TABS: 20.0000 mg | ORAL_TABLET | Freq: Every day | ORAL | 3 refills | Status: AC | PRN

## 2023-10-17 NOTE — Patient Instructions (Addendum)
Medication Instructions:  No changes    *If you need a refill on your cardiac medications before your next appointment, please call your pharmacy*   Lab Work: NONE    If you have labs (blood work) drawn today and your tests are completely normal, you will receive your results only by: MyChart Message (if you have MyChart) OR A paper copy in the mail If you have any lab test that is abnormal or we need to change your treatment, we will call you to review the results.   Testing/Procedures: Echo will be scheduled at [address].  Your physician has requested that you have an echocardiogram. Echocardiography is a painless test that uses sound waves to create images of your heart. It provides your doctor with information about the size and shape of your heart and how well your heart's chambers and valves are working. This procedure takes approximately one hour. There are no restrictions for this procedure. Please do NOT wear cologne, perfume, aftershave, or lotions (deodorant is allowed). Please arrive 15 minutes prior to your appointment time.   Your physician has recommended that you have a sleep study. This test records several body functions during sleep, including: brain activity, eye movement, oxygen and carbon dioxide blood levels, heart rate and rhythm, breathing rate and rhythm, the flow of air through your mouth and nose, snoring, body muscle movements, and chest and belly movement.    Follow-Up: At [facility], you and your health needs are our priority.  As part of our continuing mission to provide you with exceptional heart care, we have created designated Provider Care Teams.  These Care Teams include your primary Cardiologist (physician) and Advanced Practice Providers (APPs -  Physician Assistants and Nurse Practitioners) who all work together to provide you with the care you need, when you need it.  We recommend signing up for the patient portal called  "MyChart".  Sign up information is provided on this After Visit Summary.  MyChart is used to connect with patients for Virtual Visits (Telemedicine).  Patients are able to view lab/test results, encounter notes, upcoming appointments, etc.  Non-urgent messages can be sent to your provider as well.   To learn more about what you can do with MyChart, go to [email or web address].    Your next appointment:   6 month(s)  The format for your next appointment:   In Person  Provider:   Jodelle Red, MD   Other Instructions

## 2023-10-17 NOTE — Progress Notes (Signed)
Cardiology Office Note:  .    Date:  10/17/2023  ID:  Michelle Strickland, DOB 1986/09/22, MRN [id number] PCP: Loney Laurence, NP  [facility] Providers Cardiologist:  Jodelle Red, MD     History of Present Illness: Michelle Strickland is a 37 y.o. adult with a hx of nonischemic cardiomyopathy, chronic systolic and diastolic heart failure, COPD, asthma, depression, PTSD, schizophrenia, and morbid obesity, who is seen for follow-up.    CV history: 07/31/2022 he was admitted with concerns for chest pain. Troponin levels were abnormal but flat trend. EKG nonischemic. Echocardiogram showed EF of 45% with global hypokinesis. D-dimer was noted to be normal. LDL 104. Cardiology recommended coronary CTA which showed no significant disease. Cardiology recommended low-dose beta-blocker and ARB which were initiated. No aspirin or statin, planned to discuss this further at outpatient follow-up.   He was then admitted 08/06/2022 with complaints of progressively worse dyspnea, pleuritic chest pain, orthopnea, PND and lower extremity edema since leaving the hospital. He was given IV lasix. Unremarkable D-dimer. Troponin was 47 then 38 ng/L. BNP 734 pg/mL. Two-view chest radiograph showed cardiomegaly with pulmonary venous congestion, but no frank pulmonary edema. Discharged with PO lasix 40 mg.   On 08/17/2022 he presented to the ED complaining of shortness of breath and chest pain. He noted decreased urination and increased weight. Chest x-ray was clear and BNP improved to 270. Of note, when the nurse tried to ambulate the patient, his oxygen remained normal but his heart rate dropped down to the 30s. There was consideration for symptomatic bradycardia. He was admitted for telemetry monitoring. His EKG showed sinus rhythm at 56 bpm, considered right atrial enlargement, right axis deviation. Discharged 08/18/22.   In 09/2022, he reported his weight had been stable around 286 lbs at home.  He was frustrated with weight gain despite focusing on eating smaller portions. He noted that he was not urinating adequate amounts which he felt was contributory. His presyncopal episodes were improving; he only felt that way with periods of severe coughing. He was euvolemic on exam. After discussion he was amenable to starting GLP1RA. However, his insurance would not cover Wegovy.   At his visit 01/2023, he complained of daily "stretchy" chest pains that he felt was secondary to stress. He denied physical limitations while active at work. 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 His insurance coverage had changed and we started him on Wegovy.  Today, he is accompanied by a family member. He complains of intermittent left chest/shoulder discomfort associated with heart flutters. This has been occurring when he goes to lie down, but not every day. Typically lasts 5-10 minutes before spontaneously resolving. He confirms that during this time his heart is "going crazy fast", worse when lying on one side than the other.   Additionally he complains of significant lightheadedness and dizziness at times when stretching. Lately he denies any significant swelling, he has not needed to take his diuretic for a while.  Has been trying to stay consistent with his medications. He is tolerating them at this time. In the office his blood pressure is 110/78. In 05/2023 he had presented to the ED with complaints of elevated blood pressure and headache after consuming a bologna sandwich. By the time he arrived at the hospital he was starting to feel better.  Feels fatigued all the time. He doesn't feel well rested in the mornings. He tried to complete an  [place] sleep study but the machine had malfunctioned.   He has been off of Wegovy in the past 2 weeks. Lately he is obtaining refills through an Therapist, occupational.  He denies any shortness of breath, peripheral edema, syncope, orthopnea, or  PND.  ROS:  Please see the history of present illness. ROS otherwise negative except as noted.  (+) Left chest/shoulder discomfort associated with palpitations (+) Intermittent lightheadedness/dizziness (+) Fatigue  Studies Reviewed: Marland Kitchen         Physical Exam:    VS:  BP 110/78   Pulse 69   Ht 5\' 6"  (1.676 m)   Wt 281 lb (127.5 kg)   SpO2 95%   BMI 45.35 kg/m    Wt Readings from Last 3 Encounters:  10/17/23 281 lb (127.5 kg)  05/29/23 260 lb (117.9 kg)  04/12/23 260 lb (117.9 kg)    GEN: Well nourished, well developed in no acute distress HEENT: Normal, moist mucous membranes NECK: No JVD CARDIAC: regular rhythm, normal S1 and S2, no rubs or gallops. No murmur. VASCULAR: Radial and DP pulses 2+ bilaterally. No carotid bruits RESPIRATORY:  Clear to auscultation without rales, wheezing or rhonchi  ABDOMEN: Soft, non-tender, non-distended MUSCULOSKELETAL:  Ambulates independently SKIN: Warm and dry, no edema NEUROLOGIC:  Alert and oriented x 3. No focal neuro deficits noted. PSYCHIATRIC:  Normal affect   ASSESSMENT AND PLAN: .    Nonischemic cardiomyopathy Obesity, BMI 47->42->45 -he is euvolemic on exam. Has been taking torsemide PRN (not in a long time), changed on prescription today -lost to follow up, has not had repeat echo, ordered today -continue metoprolol, losartan -discussed SGLT2i. He would like to try GLP1RA first, then consider SGTL2i. Unfortunately GLP1 not currently covered by his insurance  Fatigue, daytime sleepiness Suspicion for sleep apnea -stop bang 6 -tried home sleep study before but machine stopped working. Willing to retry   Cardiac risk counseling and prevention recommendations: -recommend heart healthy/Mediterranean diet, with whole grains, fruits, vegetable, fish, lean meats, nuts, and olive oil. Limit salt. -recommend moderate walking, 3-5 times/week for 30-50 minutes each session. Aim for at least 150 minutes.week. Goal should be pace of 3  miles/hours, or walking 1.5 miles in 30 minutes -recommend avoidance of tobacco products. Avoid excess alcohol.  Dispo: Follow-up in 6 months, or sooner as needed.  I,Mathew Stumpf,acting as a Neurosurgeon for [employer], MD.,have documented all relevant documentation on the behalf of Jodelle Red, MD,as directed by  Jodelle Red, MD while in the presence of Jodelle Red, MD.  I, Jodelle Red, MD, have reviewed all documentation for this visit. The documentation on 10/17/23 for the exam, diagnosis, procedures, and orders are all accurate and complete.   Signed, Jodelle Red, MD

## 2023-11-06 ENCOUNTER — Ambulatory Visit (HOSPITAL_BASED_OUTPATIENT_CLINIC_OR_DEPARTMENT_OTHER): Payer: 59

## 2023-11-06 DIAGNOSIS — I428 Other cardiomyopathies: Secondary | ICD-10-CM

## 2023-11-06 MED ORDER — PERFLUTREN LIPID MICROSPHERE
1.0000 mL | INTRAVENOUS | Status: AC | PRN
Start: 2023-11-06 — End: 2023-11-06
  Administered 2023-11-06: 1 mL via INTRAVENOUS

## 2023-11-07 LAB — ECHOCARDIOGRAM COMPLETE
Area-P 1/2: 3.03 cm2
MV M vel: 2.57 m/s
MV Peak grad: 26.4 mm[Hg]
S' Lateral: 3.15 cm

## 2023-11-14 ENCOUNTER — Other Ambulatory Visit (HOSPITAL_BASED_OUTPATIENT_CLINIC_OR_DEPARTMENT_OTHER): Payer: Self-pay | Admitting: Cardiology

## 2023-11-14 DIAGNOSIS — I428 Other cardiomyopathies: Secondary | ICD-10-CM

## 2023-12-05 ENCOUNTER — Telehealth: Payer: Self-pay

## 2023-12-05 NOTE — Telephone Encounter (Signed)
**Note De-Identified Jacarra Bobak Obfuscation** Per [employer]: Prior Authorization/Notification is not required for the requested service(s). CPT Code: V4098 Decision ID #: [id number]

## 2023-12-17 ENCOUNTER — Other Ambulatory Visit (HOSPITAL_BASED_OUTPATIENT_CLINIC_OR_DEPARTMENT_OTHER): Payer: Self-pay | Admitting: Cardiology

## 2023-12-17 DIAGNOSIS — I428 Other cardiomyopathies: Secondary | ICD-10-CM

## 2023-12-18 ENCOUNTER — Other Ambulatory Visit: Payer: Self-pay | Admitting: *Deleted

## 2023-12-18 DIAGNOSIS — I428 Other cardiomyopathies: Secondary | ICD-10-CM

## 2023-12-18 MED ORDER — METOPROLOL SUCCINATE ER 25 MG PO TB24
12.5000 mg | ORAL_TABLET | Freq: Every day | ORAL | 3 refills | Status: DC
Start: 2023-12-18 — End: 2024-11-15

## 2023-12-18 MED ORDER — LOSARTAN POTASSIUM 25 MG PO TABS
12.5000 mg | ORAL_TABLET | Freq: Every day | ORAL | 3 refills | Status: DC
Start: 2023-12-18 — End: 2024-08-15

## 2024-02-09 ENCOUNTER — Encounter (HOSPITAL_BASED_OUTPATIENT_CLINIC_OR_DEPARTMENT_OTHER): Payer: 59 | Admitting: Cardiology

## 2024-02-13 ENCOUNTER — Encounter (HOSPITAL_BASED_OUTPATIENT_CLINIC_OR_DEPARTMENT_OTHER): Payer: 59 | Admitting: Cardiology

## 2024-03-19 ENCOUNTER — Ambulatory Visit (HOSPITAL_BASED_OUTPATIENT_CLINIC_OR_DEPARTMENT_OTHER): Payer: 59 | Attending: Cardiology | Admitting: Cardiology

## 2024-03-19 DIAGNOSIS — R400 Somnolence: Secondary | ICD-10-CM | POA: Insufficient documentation

## 2024-03-19 DIAGNOSIS — R0683 Snoring: Secondary | ICD-10-CM | POA: Diagnosis present

## 2024-03-19 DIAGNOSIS — G4733 Obstructive sleep apnea (adult) (pediatric): Secondary | ICD-10-CM | POA: Insufficient documentation

## 2024-03-26 NOTE — Procedures (Signed)
   [facility] [address] [place], [place] [address] Tel: [phone]   Fax: [phone]  Home Sleep Test Interpretation  Patient Name: Michelle Strickland, Michelle "[name]" Study Date: 03/19/2024  Date of Birth: 07/29/1986 Study Type: HST  Age: 38 year MRN #: [id number]  Sex: Female Interpreting Physician: Armanda Magic [id number]  Height: 5\' 6"  Referring Physician: Jodelle Red, MD  Weight: 281.0 lbs Recording Tech: Elaina Pattee RPSGT RST  BMI: 45.7 Scoring Tech: Holly Neeriemer RPSGT RST  ESS: 18 Neck Size: 16.5   Indications for Polysomnography The patient is a 38 year-old Female who is 5\' 6"  and weighs 281.0 lbs. Her BMI equals 45.7.  A home sleep apnea test was performed to evaluate for Obstructive Sleep Apnea.  Medication  No Data.   Polysomnogram Data A home sleep test recorded the standard physiologic parameters including EKG, nasal and oral airflow.  Respiratory parameters of chest and abdominal movements were recorded with Respiratory Inductance Plethysmography belts.  Oxygen saturation was recorded by pulse oximetry.   Study Architecture The total recording time of the polysomnogram was 376.4 minutes.  The total monitoring time was 377.0 minutes.  Time spent in Supine position was 247.0 minutes.   Respiratory Events The study revealed a presence of 0 obstructive, 0 central, and 0 mixed apneas resulting in an Apnea index of 0 events per hour.  There were 127 hypopneas (>=3% desaturation and/or arousal) resulting in an Apnea\Hypopnea Index (AHI >=3% desaturation and/or arousal) of 20.2 events per hour.  There were 94 hypopneas (>=4% desaturation) resulting in an Apnea\Hypopnea Index (AHI >=4% desaturation) of 15.0 events per hour.  There were 0 Respiratory Effort Related Arousals resulting in a RERA index of 0 events per hour. The Respiratory Disturbance Index is 20.2 events per hour.  The snore index was 10.5 events per hour.  Mean  oxygen saturation was 93.1%.  The lowest oxygen saturation during monitoring time was 86.0%.  Time spent <=88% oxygen saturation was 1.3 minutes (0.4%).  Cardiac Summary The average pulse rate was 73.9 bpm.  The minimum pulse rate was 52.0 bpm while the maximum pulse rate was 99.0 bpm.    Diagnosis:  Moderate Obstructive Sleep Apnea  Recommendations: Recommend in lab CPAP titration for treatment of sleep disordered breathing. The patient should be counseled in good sleep hygiene and avoid sleeping supine. The patient should avoid driving when sleepy.   This study was personally reviewed and electronically signed by: Armanda Magic, MD Accredited Board Certified in Sleep Medicine Date/Time: 03/26/2024 2:45PM

## 2024-03-28 ENCOUNTER — Telehealth: Payer: Self-pay

## 2024-03-28 DIAGNOSIS — I5042 Chronic combined systolic (congestive) and diastolic (congestive) heart failure: Secondary | ICD-10-CM

## 2024-03-28 DIAGNOSIS — R0683 Snoring: Secondary | ICD-10-CM

## 2024-03-28 DIAGNOSIS — I428 Other cardiomyopathies: Secondary | ICD-10-CM

## 2024-03-28 DIAGNOSIS — G4733 Obstructive sleep apnea (adult) (pediatric): Secondary | ICD-10-CM

## 2024-03-28 DIAGNOSIS — R400 Somnolence: Secondary | ICD-10-CM

## 2024-03-28 DIAGNOSIS — R0789 Other chest pain: Secondary | ICD-10-CM

## 2024-03-28 NOTE — Telephone Encounter (Signed)
-----   Message from Armanda Magic sent at 03/26/2024  2:47 PM EDT ----- Please let patient know that they have sleep apnea.  Recommend therapeutic CPAP titration for treatment of patient's sleep disordered breathing.

## 2024-03-28 NOTE — Telephone Encounter (Signed)
 Notified patient of sleep study results and recommendations. All questions (if any) were answered. Patient is concerned about childcare and will see if care can be arranged. CPAP Titration ordered today.

## 2024-05-21 ENCOUNTER — Other Ambulatory Visit (HOSPITAL_BASED_OUTPATIENT_CLINIC_OR_DEPARTMENT_OTHER): Payer: Self-pay | Admitting: Cardiology

## 2024-05-21 ENCOUNTER — Other Ambulatory Visit (HOSPITAL_BASED_OUTPATIENT_CLINIC_OR_DEPARTMENT_OTHER): Payer: Self-pay

## 2024-05-21 DIAGNOSIS — E66813 Obesity, class 3: Secondary | ICD-10-CM

## 2024-05-21 DIAGNOSIS — I428 Other cardiomyopathies: Secondary | ICD-10-CM

## 2024-05-22 ENCOUNTER — Other Ambulatory Visit: Payer: Self-pay

## 2024-05-22 ENCOUNTER — Telehealth: Payer: Self-pay | Admitting: Pharmacy Technician

## 2024-05-22 ENCOUNTER — Encounter (HOSPITAL_BASED_OUTPATIENT_CLINIC_OR_DEPARTMENT_OTHER): Payer: Self-pay

## 2024-05-22 ENCOUNTER — Other Ambulatory Visit (HOSPITAL_BASED_OUTPATIENT_CLINIC_OR_DEPARTMENT_OTHER): Payer: Self-pay

## 2024-05-22 ENCOUNTER — Other Ambulatory Visit (HOSPITAL_COMMUNITY): Payer: Self-pay

## 2024-05-22 MED ORDER — SEMAGLUTIDE-WEIGHT MANAGEMENT 0.5 MG/0.5ML ~~LOC~~ SOAJ
0.5000 mg | SUBCUTANEOUS | 1 refills | Status: DC
Start: 2024-05-22 — End: 2024-10-01
  Filled 2024-05-22 – 2024-09-04 (×9): qty 2, 28d supply, fill #0

## 2024-05-22 NOTE — Telephone Encounter (Signed)
 Pharmacy Patient Advocate Encounter  Received notification from [employer] that Prior Authorization for wegovy  has been DENIED.  See denial reason below. No denial letter attached in CMM. Will attach denial letter to Media tab once received.

## 2024-05-22 NOTE — Telephone Encounter (Signed)
 Pharmacy Patient Advocate Encounter   Received notification from [employer] that prior authorization for wegovy  0.5mg  is required/requested.   Insurance verification completed.   The patient is insured through [facility] .   Per test claim: PA required; PA submitted to above mentioned insurance via CoverMyMeds Key/confirmation #/EOC [id number] Status is pending   Options were risk reduction of major adverse cardiovascular events or solely for weight loss. Tried the cardiovascular route but came up these questions:   So went weight route

## 2024-05-22 NOTE — Telephone Encounter (Signed)
 Patient has been getting from compounding pharmacy. Wants to see if insurance will cover now. Has met deductible.

## 2024-05-23 ENCOUNTER — Other Ambulatory Visit (HOSPITAL_COMMUNITY): Payer: Self-pay

## 2024-05-23 ENCOUNTER — Other Ambulatory Visit (HOSPITAL_BASED_OUTPATIENT_CLINIC_OR_DEPARTMENT_OTHER): Payer: Self-pay

## 2024-05-23 NOTE — Telephone Encounter (Signed)
   Sent pa as requested but had to put he has not had MI, stroke, PAD. -no options for heart failure.  Pharmacy Patient Advocate Encounter   Received notification from Pt Calls Messages that prior authorization for wegovy  is required/requested.   Insurance verification completed.   The patient is insured through [facility] .   Per test claim: PA required; PA submitted to above mentioned insurance via CoverMyMeds Key/confirmation #/EOC [id number] Status is pending

## 2024-05-23 NOTE — Telephone Encounter (Signed)
   Pharmacy Patient Advocate Encounter  Received notification from [employer] that Prior Authorization for wegovy  has been DENIED.  Full denial letter will be uploaded to the media tab. See denial reason below.   PA #/Case ID/Reference #: [id number]

## 2024-05-23 NOTE — Telephone Encounter (Signed)
 Spoke with pt, he reports he spoke with the insurance and congestive heart failure would get it approved. Will forward to prior auth

## 2024-05-23 NOTE — Telephone Encounter (Signed)
 Patient called back to say they denied because our office didn't put the risk on there of not taking the medication. Patient ask that the info get sent and put urgent on the forms. Please advise

## 2024-05-24 ENCOUNTER — Other Ambulatory Visit (HOSPITAL_BASED_OUTPATIENT_CLINIC_OR_DEPARTMENT_OTHER): Payer: Self-pay

## 2024-06-05 ENCOUNTER — Telehealth: Payer: Self-pay

## 2024-06-05 DIAGNOSIS — R0683 Snoring: Secondary | ICD-10-CM

## 2024-06-05 DIAGNOSIS — G4733 Obstructive sleep apnea (adult) (pediatric): Secondary | ICD-10-CM

## 2024-06-05 DIAGNOSIS — R400 Somnolence: Secondary | ICD-10-CM

## 2024-06-05 DIAGNOSIS — I5042 Chronic combined systolic (congestive) and diastolic (congestive) heart failure: Secondary | ICD-10-CM

## 2024-06-05 NOTE — Telephone Encounter (Signed)
 I have started a PA for a CPAP titration through [facility] Provider portal and it is currently pending review:

## 2024-06-10 NOTE — Telephone Encounter (Signed)
**Note De-Identified Tawonda Legaspi Obfuscation** Per letter received Reeder Brisby fax from [facility], they have denied the pts CPAP Titration as follows:  Your provider plans sleep testing at a sleep center with an attendant watching for you. The medical records sent by your provider show you have sleep apnea. We looked at your benefit plan and medical guidelines.  This service request is covered if certain criteria are met. The notes from your provider do not show:  A medical condition that would require a sleep test with an attendant watching. (I did include the pts Echo results from 08/01/22 that shows the pts EF of 45%).  There is concern for a sleep disorder other than obstructive sleep apnea (OSA).  You have continued or new symptoms despite current treatment.  Results from a recent echocardiogram showing a low ejection fraction.  Results from recent pulmonary function tests. Based on our review, we are not able to approve this service because criteria for coverage are not met. This service is not medically necessary. You may be eligible for a home trial of a device to help keep your airway open when you sleep.  Forwarding this note to Dr Micael Adas and her Sleep Coordinator as Arlie Lain.

## 2024-06-25 ENCOUNTER — Emergency Department (HOSPITAL_COMMUNITY)
Admission: EM | Admit: 2024-06-25 | Discharge: 2024-06-25 | Disposition: A | Discharge status: Home / Self Care | Attending: Emergency Medicine | Admitting: Emergency Medicine

## 2024-06-25 ENCOUNTER — Encounter (HOSPITAL_COMMUNITY): Payer: Self-pay

## 2024-06-25 ENCOUNTER — Emergency Department (HOSPITAL_COMMUNITY)

## 2024-06-25 ENCOUNTER — Other Ambulatory Visit: Payer: Self-pay

## 2024-06-25 DIAGNOSIS — R002 Palpitations: Secondary | ICD-10-CM | POA: Insufficient documentation

## 2024-06-25 DIAGNOSIS — J449 Chronic obstructive pulmonary disease, unspecified: Secondary | ICD-10-CM | POA: Insufficient documentation

## 2024-06-25 DIAGNOSIS — I509 Heart failure, unspecified: Secondary | ICD-10-CM | POA: Diagnosis not present

## 2024-06-25 DIAGNOSIS — R0602 Shortness of breath: Secondary | ICD-10-CM | POA: Insufficient documentation

## 2024-06-25 LAB — MAGNESIUM: Magnesium: 1.6 mg/dL — ABNORMAL LOW (ref 1.7–2.4)

## 2024-06-25 LAB — COMPREHENSIVE METABOLIC PANEL WITH GFR
ALT: 27 U/L (ref 0–44)
AST: 34 U/L (ref 15–41)
Albumin: 3.7 g/dL (ref 3.5–5.0)
Alkaline Phosphatase: 46 U/L (ref 38–126)
Anion gap: 11 (ref 5–15)
BUN: 16 mg/dL (ref 6–20)
CO2: 19 mmol/L — ABNORMAL LOW (ref 22–32)
Calcium: 8.8 mg/dL — ABNORMAL LOW (ref 8.9–10.3)
Chloride: 106 mmol/L (ref 98–111)
Creatinine, Ser: 1.23 mg/dL — ABNORMAL HIGH (ref 0.44–1.00)
GFR, Estimated: 58 mL/min — ABNORMAL LOW (ref >60)
Glucose, Bld: 117 mg/dL — ABNORMAL HIGH (ref 70–99)
Potassium: 3.8 mmol/L (ref 3.5–5.1)
Sodium: 136 mmol/L (ref 135–145)
Total Bilirubin: 1.2 mg/dL (ref 0.0–1.2)
Total Protein: 6.7 g/dL (ref 6.5–8.1)

## 2024-06-25 LAB — RAPID URINE DRUG SCREEN, HOSP PERFORMED
Amphetamines: POSITIVE — AB
Barbiturates: NOT DETECTED
Benzodiazepines: NOT DETECTED
Cocaine: NOT DETECTED
Opiates: NOT DETECTED
Tetrahydrocannabinol: NOT DETECTED

## 2024-06-25 LAB — CBC WITH DIFFERENTIAL/PLATELET
Abs Immature Granulocytes: 0.03 10*3/uL (ref 0.00–0.07)
Basophils Absolute: 0 10*3/uL (ref 0.0–0.1)
Basophils Relative: 1 %
Eosinophils Absolute: 0 10*3/uL (ref 0.0–0.5)
Eosinophils Relative: 1 %
HCT: 50.3 % — ABNORMAL HIGH (ref 36.0–46.0)
Hemoglobin: 17 g/dL — ABNORMAL HIGH (ref 12.0–15.0)
Immature Granulocytes: 1 %
Lymphocytes Relative: 26 %
Lymphs Abs: 1.3 10*3/uL (ref 0.7–4.0)
MCH: 31.9 pg (ref 26.0–34.0)
MCHC: 33.8 g/dL (ref 30.0–36.0)
MCV: 94.4 fL (ref 80.0–100.0)
Monocytes Absolute: 0.4 10*3/uL (ref 0.1–1.0)
Monocytes Relative: 8 %
Neutro Abs: 3.3 10*3/uL (ref 1.7–7.7)
Neutrophils Relative %: 63 %
Platelets: 221 10*3/uL (ref 150–400)
RBC: 5.33 MIL/uL — ABNORMAL HIGH (ref 3.87–5.11)
RDW: 13.3 % (ref 11.5–15.5)
WBC: 5 10*3/uL (ref 4.0–10.5)
nRBC: 0 % (ref 0.0–0.2)

## 2024-06-25 LAB — TROPONIN I (HIGH SENSITIVITY)
Troponin I (High Sensitivity): 29 ng/L — ABNORMAL HIGH (ref <18)
Troponin I (High Sensitivity): 40 ng/L — ABNORMAL HIGH (ref <18)

## 2024-06-25 LAB — HCG, QUANTITATIVE, PREGNANCY: hCG, Beta Chain, Quant, S: 1 m[IU]/mL (ref <5)

## 2024-06-25 LAB — TSH: TSH: 1.845 u[IU]/mL (ref 0.350–4.500)

## 2024-06-25 MED ORDER — MAGNESIUM SULFATE 2 GM/50ML IV SOLN
2.0000 g | Freq: Once | INTRAVENOUS | Status: AC
  Administered 2024-06-25: 2 g via INTRAVENOUS
  Filled 2024-06-25: qty 50

## 2024-06-25 MED ORDER — LACTATED RINGERS IV BOLUS
500.0000 mL | Freq: Once | INTRAVENOUS | Status: AC
  Administered 2024-06-25: 500 mL via INTRAVENOUS

## 2024-06-25 MED ORDER — ALUM & MAG HYDROXIDE-SIMETH 200-200-20 MG/5ML PO SUSP
30.0000 mL | Freq: Once | ORAL | Status: AC
  Administered 2024-06-25: 30 mL via ORAL
  Filled 2024-06-25: qty 30

## 2024-06-25 MED ORDER — LIDOCAINE VISCOUS HCL 2 % MT SOLN
15.0000 mL | Freq: Once | OROMUCOSAL | Status: AC
  Administered 2024-06-25: 15 mL via ORAL
  Filled 2024-06-25: qty 15

## 2024-06-25 NOTE — ED Notes (Signed)
Pt placed on 1L [place] for comfort 

## 2024-06-25 NOTE — ED Notes (Signed)
 Pt admits to taking meth this am

## 2024-06-25 NOTE — ED Triage Notes (Signed)
 BIB EMS/ pt c/o palpitations/ pt appears to be anxious and restless/ hx of CHF and NSTEMI/ denies CP/ 130/80/ 82HR/ 25RR/ 111-CBG/ 98%/ Pt A&Ox4/ placed in wheelchair

## 2024-06-25 NOTE — ED Provider Notes (Addendum)
  EMERGENCY DEPARTMENT AT [facility] Provider Note   CSN: [id number] Arrival date & time: 06/25/24  1123     Patient presents with: Palpitations   Michelle Strickland is a 38 y.o. adult.    Palpitations Associated symptoms: shortness of breath   Patient presents for palpitations and shortness of breath.  Medical history includes COPD, depression, arthritis, schizophrenia, CHF.  He is prescribed metoprolol .  He takes torsemide  as needed.  He has not needed torsemide  in a while.  He has been adherent to his other home medications.  He did smoke methamphetamine this morning at 5 AM.  At around 9 AM, he was driving when he experienced some tightness in his back.  When he went to reposition, he had onset of palpitations and shortness of breath.  Symptoms were persistent through most of the morning.  They have since resolved.  Currently he feels mildly anxious but is otherwise asymptomatic.     Prior to Admission medications   Medication Sig Start Date End Date Taking? Authorizing Provider  Aspirin -Acetaminophen -Caffeine (GOODY HEADACHE PO) Take 1 packet by mouth daily as needed (headache, pain).   Yes [provider]  losartan  (COZAAR ) 25 MG tablet Take 0.5 tablets (12.5 mg total) by mouth daily. 12/18/23  Yes Lonni Slain, MD  metoprolol  succinate (TOPROL -XL) 25 MG 24 hr tablet Take 0.5 tablets (12.5 mg total) by mouth daily. 12/18/23  Yes Lonni Slain, MD  Semaglutide -Weight Management (WEGOVY ) 1.7 MG/0.75ML SOAJ Inject 1.7 mg into the skin every Thursday.   Yes [provider]  testosterone cypionate (DEPOTESTOSTERONE CYPIONATE) 200 MG/ML injection Inject 200 mg into the muscle every Thursday.   Yes [provider]  torsemide  (DEMADEX ) 20 MG tablet Take 1 tablet (20 mg total) by mouth daily as needed (weight gain or swelling). 10/17/23 10/11/24 Yes Lonni Slain, MD    Allergies: Contrast media [iodinated contrast  media], Penicillins, Toradol [ketorolac tromethamine], Ultram [tramadol], Nsaids, and Watermelon [citrullus vulgaris]    Review of Systems  Respiratory:  Positive for shortness of breath.   Cardiovascular:  Positive for palpitations.  Psychiatric/Behavioral:  The patient is nervous/anxious.   All other systems reviewed and are negative.   Updated Vital Signs BP 122/79   Pulse 65   Temp 97.7 F (36.5 C) (Oral)   Resp 15   Wt 110.2 kg   LMP  (LMP Unknown)   SpO2 100%   BMI 39.22 kg/m   Physical Exam Vitals and nursing note reviewed.  Constitutional:      General: He is not in acute distress.    Appearance: Normal appearance. He is well-developed. He is not ill-appearing, toxic-appearing or diaphoretic.  HENT:     Head: Normocephalic and atraumatic.     Right Ear: External ear normal.     Left Ear: External ear normal.     Nose: Nose normal.     Mouth/Throat:     Mouth: Mucous membranes are moist.   Eyes:     Extraocular Movements: Extraocular movements intact.     Conjunctiva/sclera: Conjunctivae normal.    Cardiovascular:     Rate and Rhythm: Normal rate and regular rhythm.     Heart sounds: No murmur heard. Pulmonary:     Effort: Pulmonary effort is normal. No respiratory distress.     Breath sounds: Normal breath sounds. No wheezing, rhonchi or rales.  Abdominal:     General: There is no distension.     Palpations: Abdomen is soft.  Tenderness: There is no abdominal tenderness.   Musculoskeletal:        General: No swelling. Normal range of motion.     Cervical back: Normal range of motion and neck supple.   Skin:    General: Skin is warm and dry.     Coloration: Skin is not jaundiced or pale.   Neurological:     General: No focal deficit present.     Mental Status: He is alert and oriented to person, place, and time.   Psychiatric:        Mood and Affect: Mood normal.        Behavior: Behavior normal.     (all labs ordered are listed, but only  abnormal results are displayed) Labs Reviewed  CBC WITH DIFFERENTIAL/PLATELET - Abnormal; Notable for the following components:      Result Value   RBC 5.33 (*)    Hemoglobin 17.0 (*)    HCT 50.3 (*)    All other components within normal limits  COMPREHENSIVE METABOLIC PANEL WITH GFR - Abnormal; Notable for the following components:   CO2 19 (*)    Glucose, Bld 117 (*)    Creatinine, Ser 1.23 (*)    Calcium 8.8 (*)    GFR, Estimated 58 (*)    All other components within normal limits  MAGNESIUM  - Abnormal; Notable for the following components:   Magnesium  1.6 (*)    All other components within normal limits  TROPONIN I (HIGH SENSITIVITY) - Abnormal; Notable for the following components:   Troponin I (High Sensitivity) 29 (*)    All other components within normal limits  TROPONIN I (HIGH SENSITIVITY) - Abnormal; Notable for the following components:   Troponin I (High Sensitivity) 40 (*)    All other components within normal limits  TSH  HCG, QUANTITATIVE, PREGNANCY  RAPID URINE DRUG SCREEN, HOSP PERFORMED    EKG: EKG Interpretation Date/Time:  Tuesday June 25 2024 11:30:56 EDT Ventricular Rate:  70 PR Interval:  134 QRS Duration:  94 QT Interval:  366 QTC Calculation: 395 R Axis:   119  Text Interpretation: Normal sinus rhythm Minimal voltage criteria for LVH, may be normal variant ( Cornell product ) Lateral infarct , age undetermined Abnormal ECG When compared with ECG of 29-May-2023 21:46, PREVIOUS ECG IS PRESENT Confirmed by Mannie Pac [phone]) on 06/25/2024 11:48:13 AM  Radiology: [facility] Chest 2 View Result Date: 06/25/2024 CLINICAL DATA:  Palpitations. EXAM: CHEST - 2 VIEW COMPARISON:  Radiographs 08/17/2022 and 08/06/2022. Cardiac CT 08/02/2022. FINDINGS: Suboptimal inspiration on the frontal examination. The heart size and mediastinal contours are normal. The lungs are clear. There is no pleural effusion or pneumothorax. No acute osseous findings are identified.  IMPRESSION: No evidence of acute cardiopulmonary process. Electronically Signed   By: Elsie Perone M.D.   On: 06/25/2024 12:37     Procedures   Medications Ordered in the ED  magnesium  sulfate IVPB 2 g 50 mL (0 g Intravenous Stopped 06/25/24 1446)  lactated ringers  bolus 500 mL (0 mLs Intravenous Stopped 06/25/24 1422)  alum & mag hydroxide-simeth (MAALOX/MYLANTA) 200-200-20 MG/5ML suspension 30 mL (30 mLs Oral Given 06/25/24 1623)    And  lidocaine  (XYLOCAINE ) 2 % viscous mouth solution 15 mL (15 mLs Oral Given 06/25/24 1623)                                    Medical Decision Making  Risk OTC drugs. Prescription drug management.   This patient presents to the ED for concern of palpitations, shortness of breath, this involves an extensive number of treatment options, and is a complaint that carries with it a high risk of complications and morbidity.  The differential diagnosis includes arrhythmia, CHF exacerbation, anxiety, substance abuse   Co morbidities / Chronic conditions that complicate the patient evaluation  COPD, depression, arthritis, schizophrenia, CHF   Additional history obtained:  Additional history obtained from EMR External records from outside source obtained and reviewed including N/A   Lab Tests:  I Ordered, and personally interpreted labs.  The pertinent results include: Hemoglobin increased in baseline suggestive of hemoconcentration; no leukocytosis is present.  Creatinine is baseline.  There is a mild non-anion gap metabolic acidosis.  Hypokalemia is present with otherwise normal electrolytes.  Troponin is slightly elevated consistent with baseline.   Imaging Studies ordered:  I ordered imaging studies including chest x-ray I independently visualized and interpreted imaging which showed no acute findings I agree with the radiologist interpretation   Cardiac Monitoring: / EKG:  The patient was maintained on a cardiac monitor.  I personally viewed  and interpreted the cardiac monitored which showed an underlying rhythm of: Sinus rhythm   Problem List / ED Course / Critical interventions / Medication management  Patient presenting for palpitations and shortness of breath.  Onset was this morning.  Symptoms have since resolved.  On arrival in the ED, vital signs are normal.  EKG shows normal sinus rhythm with a normal rate.  Workup was initiated prior to patient being bedded in the ED.  Results are notable for hypomagnesemia.  Replacement magnesium  was ordered.  He has a mild non-anion gap metabolic acidosis.  Gentle IV fluids were ordered.  No leukocytosis is present.  His troponin is mildly elevated but this is consistent with prior lab work.  On exam, patient is well-appearing.  His current breathing is unlabored.  Lungs are clear to auscultation.  He endorses some mild ongoing anxiety but is otherwise asymptomatic.  Patient remained in normal sinus rhythm.  Repeat troponin is consistent with baseline.  Patient endorsed some reflux symptoms while in the ED.  GI cocktail was ordered.  Patient was discharged in stable condition. I ordered medication including magnesium  sulfate for hypomagnesemia; IV fluids for hydration Reevaluation of the patient after these medicines showed that the patient improved I have reviewed the patients home medicines and have made adjustments as needed  Social Determinants of Health:  Lives independently     Final diagnoses:  Palpitations    ED Discharge Orders     None          Melvenia Motto, MD 06/25/24 1534    Melvenia Motto, MD 06/25/24 1640

## 2024-06-25 NOTE — ED Provider Triage Note (Signed)
 Emergency Medicine Provider Triage Evaluation Note  Michelle Strickland , a 38 y.o. adult  was evaluated in triage.  Pt complains of palpitations and anxiety, associated with shortness of breath.  No chest pain.  History of CHF.  Admits to meth use earlier today  Review of Systems  Positive:  Negative:   Physical Exam  BP (!) 162/87   Pulse 78   Temp 97.7 F (36.5 C) (Oral)   Resp (!) 22   Wt 110.2 kg   LMP  (LMP Unknown)   SpO2 100%   BMI 39.22 kg/m  Gen:   Awake, appears angry Resp:  Normal effort  MSK:   Moves extremities without difficulty, no lower extremity edema Other:    Medical Decision Making  Medically screening exam initiated at 12:08 PM.  Appropriate orders placed.  Michelle Strickland was informed that the remainder of the evaluation will be completed by another provider, this initial triage assessment does not replace that evaluation, and the importance of remaining in the ED until their evaluation is complete.  Cardiac workup, EKG unchanged, vital stable.  Suitable for waiting room.   Michelle Lynwood DEL, PA-C 06/25/24 1210

## 2024-06-25 NOTE — ED Notes (Signed)
 Called lab regarding bloodwork, lab stating can be added on existing blood

## 2024-06-25 NOTE — Discharge Instructions (Addendum)
 Your test results today were reassuring.  Your magnesium  was low but this was replaced.  Continue your home medications as prescribed.  Return to the emergency department for any new or worsening symptoms of concern.

## 2024-06-26 ENCOUNTER — Telehealth: Payer: Self-pay | Admitting: Cardiology

## 2024-06-26 NOTE — Telephone Encounter (Signed)
 Patient c/o Palpitations:  STAT if patient reporting lightheadedness, shortness of breath, or chest pain  How long have you had palpitations/irregular HR/ Afib? Are you having the symptoms now? Since yesterday, yes  Are you currently experiencing lightheadedness, SOB or CP? Sob   Do you have a history of afib (atrial fibrillation) or irregular heart rhythm? chf  Have you checked your BP or HR? (document readings if available): no  Are you experiencing any other symptoms? Dizzy, fatigue

## 2024-06-26 NOTE — Telephone Encounter (Signed)
 Spoke with patient regarding palpitations Yesterday morning he started having fast heart beat and felt like it was pounding Got home took medications, continue for about 3 to 4 hours Today feeling fatigue, can still feel heart beating but thinks may be more aware  Denies any pain  This is different than what has had in the past   Scheduled f/u 8/7 and will forward to Dr Lonni for review

## 2024-07-01 NOTE — Telephone Encounter (Signed)
**Note De-Identified Leny Morozov Obfuscation** I started another CPAP Titration PA through the [facility] Provider Portal. Notification/prior authorization case: [id number]

## 2024-07-05 NOTE — Telephone Encounter (Signed)
**Note De-Identified Amyjo Mizrachi Obfuscation** Per letter received from [facility] Michelle Strickland fax, they have denied this CPAP titration as well and are now recommending APAP.  Forwarding this note to Dr Shlomo for her recommendation.

## 2024-07-25 NOTE — Addendum Note (Signed)
 Addended by: JOSHUA DALTON MATSU on: 07/25/2024 03:25 PM   Modules accepted: Orders

## 2024-07-25 NOTE — Telephone Encounter (Signed)
 Per Dr Shlomo,  Order ResMed CPAP on auto from 4 to 15cm H2O with heated humidity, mask of choice   Upon patient request DME selection is [facility]. Patient understands he will be contacted by [facility] to set up his cpap. Patient understands to call if [facility] does not contact him with new setup in a timely manner. Patient understands they will be called once confirmation has been received from [facility]/ that they have received their new machine to schedule 10 week follow up appointment.   [facility] notified of new cpap order  Please add to airview Patient was grateful for the call and thanked me.

## 2024-07-26 NOTE — Procedures (Signed)
 Michelle Strickland

## 2024-08-01 ENCOUNTER — Ambulatory Visit (HOSPITAL_BASED_OUTPATIENT_CLINIC_OR_DEPARTMENT_OTHER): Payer: Self-pay | Admitting: Cardiology

## 2024-08-12 ENCOUNTER — Other Ambulatory Visit: Payer: Self-pay | Admitting: Cardiology

## 2024-08-12 DIAGNOSIS — I428 Other cardiomyopathies: Secondary | ICD-10-CM

## 2024-08-14 ENCOUNTER — Other Ambulatory Visit: Payer: Self-pay | Admitting: Cardiology

## 2024-08-14 DIAGNOSIS — I428 Other cardiomyopathies: Secondary | ICD-10-CM

## 2024-08-30 ENCOUNTER — Other Ambulatory Visit: Payer: Self-pay

## 2024-08-30 ENCOUNTER — Other Ambulatory Visit (HOSPITAL_COMMUNITY): Payer: Self-pay

## 2024-08-30 ENCOUNTER — Other Ambulatory Visit (HOSPITAL_BASED_OUTPATIENT_CLINIC_OR_DEPARTMENT_OTHER): Payer: Self-pay

## 2024-09-02 ENCOUNTER — Other Ambulatory Visit: Payer: Self-pay

## 2024-09-04 ENCOUNTER — Other Ambulatory Visit (HOSPITAL_BASED_OUTPATIENT_CLINIC_OR_DEPARTMENT_OTHER): Payer: Self-pay

## 2024-09-04 ENCOUNTER — Other Ambulatory Visit (HOSPITAL_COMMUNITY): Payer: Self-pay

## 2024-09-04 ENCOUNTER — Telehealth: Payer: Self-pay | Admitting: Cardiology

## 2024-09-04 ENCOUNTER — Telehealth: Payer: Self-pay | Admitting: Pharmacy Technician

## 2024-09-04 NOTE — Telephone Encounter (Signed)
 Pharmacy Patient Advocate Encounter   Received notification from Pt Calls Messages that prior authorization for wegovy  1.7 MG is required/requested.   Insurance verification completed.   The patient is insured through [facility] MEDICAID .   Per test claim: PA required; PA submitted to above mentioned insurance via Latent Key/confirmation #/EOC [id number] Status is pending

## 2024-09-04 NOTE — Telephone Encounter (Signed)
 Patient would like a prior auth sent for Semaglutide -Weight Management (WEGOVY ) 1.7 MG/0.75ML SOAJ.

## 2024-09-04 NOTE — Telephone Encounter (Signed)
 Pharmacy Patient Advocate Encounter  Received notification from [facility] MEDICAID that Prior Authorization for Wegovy  has been APPROVED from 09/04/24 to 09/04/25   PA #/Case ID/Reference #: [id number]

## 2024-09-27 ENCOUNTER — Encounter (HOSPITAL_BASED_OUTPATIENT_CLINIC_OR_DEPARTMENT_OTHER): Payer: Self-pay

## 2024-09-30 ENCOUNTER — Other Ambulatory Visit (HOSPITAL_BASED_OUTPATIENT_CLINIC_OR_DEPARTMENT_OTHER): Payer: Self-pay

## 2024-09-30 ENCOUNTER — Other Ambulatory Visit (HOSPITAL_BASED_OUTPATIENT_CLINIC_OR_DEPARTMENT_OTHER): Payer: Self-pay | Admitting: Cardiology

## 2024-09-30 DIAGNOSIS — E66813 Obesity, class 3: Secondary | ICD-10-CM

## 2024-09-30 DIAGNOSIS — I428 Other cardiomyopathies: Secondary | ICD-10-CM

## 2024-10-01 ENCOUNTER — Other Ambulatory Visit (HOSPITAL_BASED_OUTPATIENT_CLINIC_OR_DEPARTMENT_OTHER): Payer: Self-pay

## 2024-10-01 MED ORDER — SEMAGLUTIDE-WEIGHT MANAGEMENT 1 MG/0.5ML ~~LOC~~ SOAJ
1.0000 mg | SUBCUTANEOUS | 0 refills | Status: DC
  Filled 2024-10-01 – 2024-10-04 (×3): qty 2, 28d supply, fill #0

## 2024-10-02 ENCOUNTER — Other Ambulatory Visit (HOSPITAL_COMMUNITY): Payer: Self-pay

## 2024-10-02 ENCOUNTER — Telehealth: Payer: Self-pay | Admitting: Pharmacy Technician

## 2024-10-02 NOTE — Telephone Encounter (Signed)
   Pharmacy Patient Advocate Encounter   Received notification from CoverMyMeds that prior authorization for wegovy  1mg  is required/requested.   Insurance verification completed.   The patient is insured through [facility] MEDICAID.   Per test claim: PA required; PA submitted to above mentioned insurance via Latent Key/confirmation #/EOC [id number] Status is pending

## 2024-10-03 ENCOUNTER — Telehealth (HOSPITAL_COMMUNITY): Payer: Self-pay | Admitting: Pharmacy Technician

## 2024-10-03 ENCOUNTER — Telehealth (HOSPITAL_COMMUNITY): Payer: Self-pay

## 2024-10-03 ENCOUNTER — Other Ambulatory Visit: Payer: Self-pay

## 2024-10-03 ENCOUNTER — Other Ambulatory Visit (HOSPITAL_COMMUNITY): Payer: Self-pay

## 2024-10-03 ENCOUNTER — Encounter (HOSPITAL_COMMUNITY): Payer: Self-pay

## 2024-10-03 NOTE — Telephone Encounter (Signed)
 Pharmacy Patient Advocate Encounter   Received notification from Pt Calls Messages that prior authorization for Wegovy  1 mg/0.5 ml auto injectors is required/requested.   Insurance verification completed.   The patient is insured through [place] MEDICAID.   Per test claim: Effective October 1st, Medicaid will discontinue coverage of GLP1 medications for weight loss (such as Wegovy  and Zepbound), unless the patient has a documented history of a heart attack or stroke. Zepbound will continue to be covered only for patients with moderate to severe sleep apnea (AHI 15-30) and a BMI greater than 40. Because of this change, the prior authorization team will not be submitting new PA requests for GLP1 medications prescribed for weight loss, as patients will be unable to continue therapy under Medicaid coverage.

## 2024-10-03 NOTE — Telephone Encounter (Signed)
 Pharmacy Patient Advocate Encounter  Received notification from [facility] MEDICAID that Prior Authorization for wegovy  has been DENIED.  Full denial letter will be uploaded to the media tab. See denial reason below.   PA #/Case ID/Reference #: [id number]      We submitted bmi over 40, submitted notes saying: hx of CHF and NSTEMI but missing everything else in chart notes and his age does not meet the age requirement

## 2024-10-03 NOTE — Telephone Encounter (Signed)
 PA request has been Received. New Encounter has been or will be created for follow up. For additional info see Pharmacy Prior Auth telephone encounter from 10/03/24.

## 2024-10-04 ENCOUNTER — Other Ambulatory Visit: Payer: Self-pay | Admitting: Medical Genetics

## 2024-10-04 NOTE — Telephone Encounter (Signed)
 Will route this message to Dr. Lonni and covering RN to make them aware of wegovy  denial per PA team.

## 2024-10-05 ENCOUNTER — Other Ambulatory Visit (HOSPITAL_COMMUNITY): Payer: Self-pay

## 2024-10-05 ENCOUNTER — Encounter (HOSPITAL_COMMUNITY): Payer: Self-pay

## 2024-10-07 ENCOUNTER — Other Ambulatory Visit: Payer: Self-pay

## 2024-10-08 ENCOUNTER — Other Ambulatory Visit (HOSPITAL_COMMUNITY): Payer: Self-pay

## 2024-10-08 NOTE — Telephone Encounter (Signed)
 Pharmacy Patient Advocate Encounter   Received notification from Physician's Office that prior authorization for ZEPBOUND is required/requested.   Insurance verification completed.   The patient is insured through [facility] MEDICAID.   Per test claim: PA required; PA submitted to above mentioned insurance via Latent Key/confirmation #/EOC [id number] Status is pending

## 2024-10-09 ENCOUNTER — Other Ambulatory Visit (HOSPITAL_COMMUNITY): Payer: Self-pay

## 2024-10-09 NOTE — Telephone Encounter (Signed)
 Pharmacy Patient Advocate Encounter  Received notification from [facility] MEDICAID that Prior Authorization for ZEPBOUND has been APPROVED from 10/08/24 to 04/08/25. Ran test claim, Copay is $4. This test claim was processed through [facility]- copay amounts may vary at other pharmacies due to pharmacy/plan contracts, or as the patient moves through the different stages of their insurance plan.

## 2024-10-14 ENCOUNTER — Other Ambulatory Visit (HOSPITAL_BASED_OUTPATIENT_CLINIC_OR_DEPARTMENT_OTHER): Payer: Self-pay

## 2024-10-14 ENCOUNTER — Other Ambulatory Visit (HOSPITAL_COMMUNITY): Payer: Self-pay

## 2024-10-14 MED ORDER — ZEPBOUND 5 MG/0.5ML ~~LOC~~ SOAJ
5.0000 mg | SUBCUTANEOUS | 0 refills | Status: DC
  Filled 2024-10-14 – 2024-11-15 (×2): qty 2, 28d supply, fill #0

## 2024-10-14 MED ORDER — ZEPBOUND 7.5 MG/0.5ML ~~LOC~~ SOAJ
7.5000 mg | SUBCUTANEOUS | 0 refills | Status: DC
  Filled 2024-10-14: qty 2, 28d supply, fill #0

## 2024-10-14 MED ORDER — ZEPBOUND 15 MG/0.5ML ~~LOC~~ SOAJ
15.0000 mg | SUBCUTANEOUS | 0 refills | Status: DC
  Filled 2024-10-14: qty 2, 28d supply, fill #0

## 2024-10-14 MED ORDER — ZEPBOUND 10 MG/0.5ML ~~LOC~~ SOAJ
10.0000 mg | SUBCUTANEOUS | 0 refills | Status: DC
  Filled 2024-10-14: qty 2, 28d supply, fill #0

## 2024-10-14 MED ORDER — ZEPBOUND 12.5 MG/0.5ML ~~LOC~~ SOAJ
12.5000 mg | SUBCUTANEOUS | 0 refills | Status: DC
  Filled 2024-10-14: qty 2, 28d supply, fill #0

## 2024-10-14 MED ORDER — ZEPBOUND 2.5 MG/0.5ML ~~LOC~~ SOAJ
2.5000 mg | SUBCUTANEOUS | 0 refills | Status: DC
  Filled 2024-10-14 (×2): qty 2, 28d supply, fill #0

## 2024-10-14 NOTE — Addendum Note (Signed)
 Addended by: Azion Centrella on: 10/14/2024 08:34 AM   Modules accepted: Orders

## 2024-10-14 NOTE — Telephone Encounter (Signed)
 Prescription sent per Dr. Lonni to [facility].  Pt notified.

## 2024-10-15 ENCOUNTER — Other Ambulatory Visit (HOSPITAL_COMMUNITY): Payer: Self-pay

## 2024-10-21 ENCOUNTER — Other Ambulatory Visit (HOSPITAL_BASED_OUTPATIENT_CLINIC_OR_DEPARTMENT_OTHER): Payer: Self-pay

## 2024-11-15 ENCOUNTER — Other Ambulatory Visit: Payer: Self-pay

## 2024-11-15 ENCOUNTER — Other Ambulatory Visit (HOSPITAL_COMMUNITY): Payer: Self-pay

## 2024-11-15 DIAGNOSIS — I428 Other cardiomyopathies: Secondary | ICD-10-CM

## 2024-11-18 ENCOUNTER — Other Ambulatory Visit (HOSPITAL_COMMUNITY): Payer: Self-pay

## 2024-11-19 MED ORDER — METOPROLOL SUCCINATE ER 25 MG PO TB24: 12.5000 mg | ORAL_TABLET | Freq: Every day | ORAL | 0 refills | Status: DC

## 2024-11-25 ENCOUNTER — Emergency Department (HOSPITAL_BASED_OUTPATIENT_CLINIC_OR_DEPARTMENT_OTHER): Payer: MEDICAID

## 2024-11-25 ENCOUNTER — Encounter (HOSPITAL_BASED_OUTPATIENT_CLINIC_OR_DEPARTMENT_OTHER): Payer: Self-pay

## 2024-11-25 ENCOUNTER — Other Ambulatory Visit: Payer: Self-pay

## 2024-11-25 ENCOUNTER — Emergency Department (HOSPITAL_BASED_OUTPATIENT_CLINIC_OR_DEPARTMENT_OTHER)
Admission: EM | Admit: 2024-11-25 | Discharge: 2024-11-25 | Discharge status: Against Medical Advice | Payer: MEDICAID | Attending: Emergency Medicine | Admitting: Emergency Medicine

## 2024-11-25 ENCOUNTER — Emergency Department (HOSPITAL_BASED_OUTPATIENT_CLINIC_OR_DEPARTMENT_OTHER): Payer: MEDICAID | Admitting: Radiology

## 2024-11-25 DIAGNOSIS — J188 Other pneumonia, unspecified organism: Secondary | ICD-10-CM | POA: Diagnosis present

## 2024-11-25 DIAGNOSIS — R0902 Hypoxemia: Secondary | ICD-10-CM

## 2024-11-25 DIAGNOSIS — I509 Heart failure, unspecified: Secondary | ICD-10-CM

## 2024-11-25 LAB — HEPATIC FUNCTION PANEL
ALT: 23 U/L (ref 0–44)
AST: 33 U/L (ref 15–41)
Albumin: 3.9 g/dL (ref 3.5–5.0)
Alkaline Phosphatase: 72 U/L (ref 38–126)
Bilirubin, Direct: 0.4 mg/dL — ABNORMAL HIGH (ref 0.0–0.2)
Indirect Bilirubin: 0.7 mg/dL (ref 0.3–0.9)
Total Bilirubin: 1.1 mg/dL (ref 0.0–1.2)
Total Protein: 6.8 g/dL (ref 6.5–8.1)

## 2024-11-25 LAB — TROPONIN T, HIGH SENSITIVITY
Troponin T High Sensitivity: 15 ng/L (ref 0–19)
Troponin T High Sensitivity: 15 ng/L (ref 0–19)

## 2024-11-25 LAB — CBC
HCT: 48.2 % — ABNORMAL HIGH (ref 36.0–46.0)
Hemoglobin: 15.7 g/dL — ABNORMAL HIGH (ref 12.0–15.0)
MCH: 30.5 pg (ref 26.0–34.0)
MCHC: 32.6 g/dL (ref 30.0–36.0)
MCV: 93.6 fL (ref 80.0–100.0)
Platelets: 239 K/uL (ref 150–400)
RBC: 5.15 MIL/uL — ABNORMAL HIGH (ref 3.87–5.11)
RDW: 13.9 % (ref 11.5–15.5)
WBC: 8.7 K/uL (ref 4.0–10.5)
nRBC: 0 % (ref 0.0–0.2)

## 2024-11-25 LAB — BASIC METABOLIC PANEL WITH GFR
Anion gap: 10 (ref 5–15)
BUN: 15 mg/dL (ref 6–20)
CO2: 24 mmol/L (ref 22–32)
Calcium: 9.3 mg/dL (ref 8.9–10.3)
Chloride: 103 mmol/L (ref 98–111)
Creatinine, Ser: 1.09 mg/dL — ABNORMAL HIGH (ref 0.44–1.00)
GFR, Estimated: >60 mL/min (ref >60)
Glucose, Bld: 110 mg/dL — ABNORMAL HIGH (ref 70–99)
Potassium: 4.3 mmol/L (ref 3.5–5.1)
Sodium: 137 mmol/L (ref 135–145)

## 2024-11-25 LAB — RESP PANEL BY RT-PCR (RSV, FLU A&B, COVID)  RVPGX2
Influenza A by PCR: NEGATIVE
Influenza B by PCR: NEGATIVE
Resp Syncytial Virus by PCR: NEGATIVE
SARS Coronavirus 2 by RT PCR: NEGATIVE

## 2024-11-25 LAB — LACTIC ACID, PLASMA: Lactic Acid, Venous: 1.5 mmol/L (ref 0.5–1.9)

## 2024-11-25 LAB — HCG, SERUM, QUALITATIVE: Preg, Serum: NEGATIVE

## 2024-11-25 LAB — PRO BRAIN NATRIURETIC PEPTIDE: Pro Brain Natriuretic Peptide: 832 pg/mL — ABNORMAL HIGH

## 2024-11-25 MED ORDER — SODIUM CHLORIDE 0.9 % IV SOLN
2.0000 g | Freq: Once | INTRAVENOUS | Status: AC
  Administered 2024-11-25: 2 g via INTRAVENOUS
  Filled 2024-11-25: qty 20

## 2024-11-25 MED ORDER — AZITHROMYCIN 250 MG PO TABS: 250.0000 mg | ORAL_TABLET | Freq: Every day | ORAL | 0 refills | Status: AC

## 2024-11-25 MED ORDER — HYDROMORPHONE HCL 1 MG/ML IJ SOLN
0.5000 mg | Freq: Once | INTRAMUSCULAR | Status: AC
  Administered 2024-11-25: 0.5 mg via INTRAVENOUS
  Filled 2024-11-25: qty 1

## 2024-11-25 MED ORDER — FUROSEMIDE 10 MG/ML IJ SOLN
80.0000 mg | Freq: Once | INTRAMUSCULAR | Status: AC
  Administered 2024-11-25: 80 mg via INTRAVENOUS
  Filled 2024-11-25: qty 8

## 2024-11-25 MED ORDER — HYDROMORPHONE HCL 1 MG/ML IJ SOLN
1.0000 mg | Freq: Once | INTRAMUSCULAR | Status: AC
  Administered 2024-11-25: 1 mg via INTRAVENOUS
  Filled 2024-11-25: qty 1

## 2024-11-25 MED ORDER — IOHEXOL 350 MG/ML SOLN
75.0000 mL | Freq: Once | INTRAVENOUS | Status: AC | PRN
  Administered 2024-11-25: 75 mL via INTRAVENOUS

## 2024-11-25 MED ORDER — ONDANSETRON HCL 4 MG/2ML IJ SOLN
4.0000 mg | Freq: Once | INTRAMUSCULAR | Status: AC
  Administered 2024-11-25: 4 mg via INTRAVENOUS
  Filled 2024-11-25: qty 2

## 2024-11-25 MED ORDER — MORPHINE SULFATE (PF) 4 MG/ML IV SOLN
4.0000 mg | Freq: Once | INTRAVENOUS | Status: AC
  Administered 2024-11-25: 4 mg via INTRAVENOUS
  Filled 2024-11-25: qty 1

## 2024-11-25 MED ORDER — DOXYCYCLINE HYCLATE 100 MG PO CAPS: 100.0000 mg | ORAL_CAPSULE | Freq: Two times a day (BID) | ORAL | 0 refills | Status: AC

## 2024-11-25 MED ORDER — SODIUM CHLORIDE 0.9 % IV SOLN
500.0000 mg | Freq: Once | INTRAVENOUS | Status: AC
  Administered 2024-11-25: 500 mg via INTRAVENOUS
  Filled 2024-11-25: qty 5

## 2024-11-25 MED ORDER — IPRATROPIUM-ALBUTEROL 0.5-2.5 (3) MG/3ML IN SOLN
3.0000 mL | Freq: Once | RESPIRATORY_TRACT | Status: AC
  Administered 2024-11-25: 3 mL via RESPIRATORY_TRACT
  Filled 2024-11-25: qty 3

## 2024-11-25 MED ORDER — METHYLPREDNISOLONE SODIUM SUCC 125 MG IJ SOLR
125.0000 mg | Freq: Once | INTRAMUSCULAR | Status: AC
  Administered 2024-11-25: 125 mg via INTRAVENOUS
  Filled 2024-11-25: qty 2

## 2024-11-25 MED ORDER — FUROSEMIDE 20 MG PO TABS
20.0000 mg | ORAL_TABLET | Freq: Once | ORAL | Status: AC
  Administered 2024-11-25: 20 mg via ORAL
  Filled 2024-11-25: qty 1

## 2024-11-25 NOTE — ED Triage Notes (Signed)
 Pt c/o cough onset this AM, coughing up blood- a lot, SHOB, CP, symptom onset Saturday, worsening since. Hx CHF, advises noncompliance w lasix .   Virtual visit w UC, advised to be seen for further eval

## 2024-11-25 NOTE — Progress Notes (Signed)
 [facility] Video Visit   Patient ID:  Michelle Strickland is a 38 y.o. November 06, 1986 adult. Place of service: patient home Patient has been advised as to the limitations and limited nature of physical exam due to nature of a video visit, the possibility of privacy risk in the use of a video visit, and that the healthcare provider may recommend visiting a healthcare clinic for in-person care and follow up.  Video Visit Assessment and Plan   1. Cough, unspecified type (Primary) 2. Coughing up blood    Patient with SOB, cough. Reports coughing up blood He does not appear to be in acute distress on VV.  Advised to be seen in the closest [facility] or PCP immediately  May be referred to ED pending evaluation to r/o PE Advised to f/u for in person evaluation or send Mychart message to the [facility] team if symptoms are not improving or if worsening.    Patient's Medications    Patient's Medications       * Accurate as of November 25, 2024 10:44 AM. Reflects encounter med changes as of last refresh          Continued Medications      Instructions  clonazePAM 0.5 mg tablet Commonly known as: KLONOPIN  0.5 mg, Oral, 2 times a day as needed   cloNIDine 0.1 mg tablet Commonly known as: CATAPRES  0.1 mg, Oral, 2 times a day as needed   DULoxetine HCl 20 mg capsule Commonly known as: CYMBALTA  20 mg, Oral, At bedtime   EPINEPHrine  0.3 mg/0.3 mL injection Commonly known as: EPIPEN  2-PAK  0.3 mg, Intramuscular, Once as needed   losartan  potassium 25 mg tablet Commonly known as: COZAAR   12.5 mg, Daily   metoprolol  succinate 25 mg 24 hr tablet Commonly known as: TOPROL -XL  12.5 mg, Daily   testosterone enanthate 200 MG/ML injection Commonly known as: DELATESTRYL  600 mg, Intramuscular, Weekly   ZEPBOUND  2.5 MG/0.5ML injection Generic drug: tirzepatide   2.5 mg, Weekly at 0900           Risk, benefits, and alternatives were provided through patient instructions given to  the patient electronically and during the video interaction.  If any worsening symptoms or lack of improvement, the patient will seek immediate medical care.  Video Visit History   HPI Chief Complaint  Patient presents with  . URI    C/O productive (bright red) cough, chest hurts when coughing, some shortness of breath, headache, sore throat x yesterday. Denies fever, n/v/d. OTC meds- none. Pt has not done a covid or flu test.    Symptoms started yesterday.  Reports productive bright red blood with mucous Chest hurts with coughing Some SOB, sore throat, headaches from coughing   States lot of blood when he coughs  Not tested for COVID/flu  Denies: history of bleeding disorders or blood clots    Reviewed and updated this visit by provider: Tobacco  Allergies  Meds  Problems  Med Hx  OB Hx  SE Hx  Surg Hx  Fam Hx  Soc Hx       ROS: As documented in the history above, all other relevant system complaints were negative.  Video Visit Objective Findings  Examination conducted with the use of video cameras/computer monitors. Vital signs and other aspects of physical exam are limited due to the nature of this encounter.   Constitutional: No apparent acute distress noted during the video interaction; Alert and oriented with normal mentation and  verbally interactive. Mood: Appears appropriate to situation.   No LOS data to display   *Some images could not be shown.

## 2024-11-25 NOTE — ED Provider Notes (Addendum)
 [place] EMERGENCY DEPARTMENT AT [facility] Provider Note   CSN: [id number] Arrival date & time: 11/25/24  1149     Patient presents with: Shortness of Breath and Chest Pain   Michelle Strickland is a 38 y.o. adult.  Patient is a 38 year old transgender female to female who presents to the ED for increasing cough, congestion, shortness of breath for the past 2 to 3 days.  Patient notes he was not feeling well over the weekend.  Last night the chest tightness and shortness of breath became much worse.  Notes he was coughing all night and began to have blood-tinged sputum.  He notes he has had 4 episodes of bright red blood after coughing this morning.  States chest feels tight very tight and is hurting.  Notes he does have a history of CHF and asthma.  Does not currently have inhalers and has not been taking Lasix .  States he has never taken his Lasix  since it has been prescribed.  Denies sick contacts.  Endorses a headache.  Denies fevers, chills, dizziness, syncope, abdominal pain, nausea/vomiting/diarrhea.  No further complaints.    Shortness of Breath Associated symptoms: chest pain, cough and headaches   Associated symptoms: no abdominal pain, no fever, no vomiting and no wheezing   Chest Pain Associated symptoms: cough, headache and shortness of breath   Associated symptoms: no abdominal pain, no dizziness, no fever, no nausea and no vomiting        Prior to Admission medications   Medication Sig Start Date End Date Taking? Authorizing Provider  azithromycin  (ZITHROMAX ) 250 MG tablet Take 1 tablet (250 mg total) by mouth daily. Take first 2 tablets together, then 1 every day until finished. 11/25/24  Yes Brittin Belnap, Thersia RAMAN, PA-C  doxycycline  (VIBRAMYCIN ) 100 MG capsule Take 1 capsule (100 mg total) by mouth 2 (two) times daily for 7 days. 11/25/24 12/02/24 Yes Huckleberry Martinson, Thersia RAMAN, PA-C  DULoxetine (CYMBALTA) 20 MG capsule Take 20 mg by mouth at bedtime. 11/04/24  Yes [provider]  testosterone enanthate (DELATESTRYL) 200 MG/ML injection Inject 600 mg into the muscle once a week. 10/18/24  Yes [provider]  losartan  (COZAAR ) 25 MG tablet Take 1/2 (one-half) tablet by mouth once daily 08/15/24   Lonni Slain, MD  metoprolol  succinate (TOPROL -XL) 25 MG 24 hr tablet Take 0.5 tablets (12.5 mg total) by mouth daily. 11/19/24   Lonni Slain, MD  testosterone cypionate (DEPOTESTOSTERONE CYPIONATE) 200 MG/ML injection Inject 200 mg into the muscle every Thursday.    [provider]  tirzepatide  (ZEPBOUND ) 10 MG/0.5ML Pen Inject 10 mg into the skin once a week. 01/14/25   Lonni Slain, MD  tirzepatide  (ZEPBOUND ) 12.5 MG/0.5ML Pen Inject 12.5 mg into the skin once a week. 02/14/25   Lonni Slain, MD  tirzepatide  (ZEPBOUND ) 15 MG/0.5ML Pen Inject 15 mg into the skin once a week. 03/14/25   Lonni Slain, MD  tirzepatide  (ZEPBOUND ) 5 MG/0.5ML Pen Inject 5 mg into the skin once a week. 11/14/24   Lonni Slain, MD  tirzepatide  (ZEPBOUND ) 7.5 MG/0.5ML Pen Inject 7.5 mg into the skin once a week. 12/14/24   Lonni Slain, MD  torsemide  (DEMADEX ) 20 MG tablet Take 1 tablet (20 mg total) by mouth daily as needed (weight gain or swelling). 10/17/23 10/11/24  Lonni Slain, MD    Allergies: Contrast media [iodinated contrast media], Penicillins, Toradol [ketorolac tromethamine], Ultram [tramadol], Nsaids, and Watermelon [citrullus vulgaris]    Review of Systems  Constitutional:  Negative for  chills and fever.  HENT:  Positive for congestion.   Eyes:  Negative for visual disturbance.  Respiratory:  Positive for cough, chest tightness and shortness of breath. Negative for wheezing.   Cardiovascular:  Positive for chest pain.  Gastrointestinal:  Negative for abdominal pain, diarrhea, nausea and vomiting.  Neurological:  Positive for headaches. Negative for dizziness.  All other systems  reviewed and are negative.   Updated Vital Signs BP (!) 124/91   Pulse 76   Temp 97.8 F (36.6 C) (Oral)   Resp (!) 22   SpO2 92%   Physical Exam Constitutional:      Appearance: He is obese.  HENT:     Head: Normocephalic and atraumatic.  Cardiovascular:     Rate and Rhythm: Normal rate.  Pulmonary:     Effort: Tachypnea present. No accessory muscle usage.     Breath sounds: Normal breath sounds.  Skin:    General: Skin is warm and dry.  Neurological:     Mental Status: He is alert and oriented to person, place, and time.  Psychiatric:        Mood and Affect: Mood normal.        Behavior: Behavior normal.     (all labs ordered are listed, but only abnormal results are displayed) Labs Reviewed  BASIC METABOLIC PANEL WITH GFR - Abnormal; Notable for the following components:      Result Value   Glucose, Bld 110 (*)    Creatinine, Ser 1.09 (*)    All other components within normal limits  CBC - Abnormal; Notable for the following components:   RBC 5.15 (*)    Hemoglobin 15.7 (*)    HCT 48.2 (*)    All other components within normal limits  PRO BRAIN NATRIURETIC PEPTIDE - Abnormal; Notable for the following components:   Pro Brain Natriuretic Peptide 832.0 (*)    All other components within normal limits  HEPATIC FUNCTION PANEL - Abnormal; Notable for the following components:   Bilirubin, Direct 0.4 (*)    All other components within normal limits  RESP PANEL BY RT-PCR (RSV, FLU A&B, COVID)  RVPGX2  CULTURE, BLOOD (ROUTINE X 2)  CULTURE, BLOOD (ROUTINE X 2)  HCG, SERUM, QUALITATIVE  LACTIC ACID, PLASMA  TROPONIN T, HIGH SENSITIVITY  TROPONIN T, HIGH SENSITIVITY    EKG: EKG Interpretation Date/Time:  Monday November 25 2024 11:59:10 EST Ventricular Rate:  86 PR Interval:  130 QRS Duration:  92 QT Interval:  328 QTC Calculation: 392 R Axis:   126  Text Interpretation: Normal sinus rhythm Right atrial enlargement Left posterior fascicular block Confirmed  by Cottie Cough [phone]) on 11/25/2024 3:11:11 PM  Radiology: CT Angio Chest PE W and/or Wo Contrast Result Date: 11/25/2024 CLINICAL DATA:  Pulmonary embolism (PE) suspected, high prob EXAM: CT ANGIOGRAPHY CHEST WITH CONTRAST TECHNIQUE: Multidetector CT imaging of the chest was performed using the standard protocol during bolus administration of intravenous contrast. Multiplanar CT image reconstructions and MIPs were obtained to evaluate the vascular anatomy. RADIATION DOSE REDUCTION: This exam was performed according to the departmental dose-optimization program which includes automated exposure control, adjustment of the mA and/or kV according to patient size and/or use of iterative reconstruction technique. CONTRAST:  75mL OMNIPAQUE  IOHEXOL  350 MG/ML SOLN COMPARISON:  August 08/14/2022 FINDINGS: Pulmonary Embolism: No pulmonary embolism. Cardiovascular: No cardiomegaly. Trace pericardial effusion.No aortic aneurysm. Normal variant 2 vessel aortic arch anatomy. Mediastinum/Nodes: No mediastinal mass.Small amount of nonexpansile soft tissue in the anterior mediastinum, likely  residual thymic tissueNo mediastinal, hilar, or axillary lymphadenopathy. Lungs/Pleura: The midline trachea and bronchi are patent. Multifocal ground-glass airspace opacities with regions of dependent consolidation noted throughout both lungs. Trace right pleural effusion. No pneumothorax. Musculoskeletal: No acute fracture or destructive bone lesion. Multilevel thoracic osteophytosis. Upper Abdomen: No acute abnormality in the partially visualized upper abdomen. Review of the MIP images confirms the above findings. IMPRESSION: 1. No pulmonary embolism. 2. Findings consistent with multifocal pneumonia. Trace right pleural effusion. Electronically Signed   By: Rogelia Myers M.D.   On: 11/25/2024 14:51   DG Chest 2 View Result Date: 11/25/2024 EXAM: 2 VIEW(S) XRAY OF THE CHEST 11/25/2024 12:19:00 PM COMPARISON: 06/25/2024 CLINICAL  HISTORY: cp FINDINGS: LUNGS AND PLEURA: New patchy perihilar and upper lung airspace opacities, particularly in the right suprahilar region, which could be a manifestation of bilateral pneumonia, aspiration pneumonitis, hemorrhage, or acute pulmonary edema. No pleural effusion. No pneumothorax. HEART AND MEDIASTINUM: Mild cardiomegaly. BONES AND SOFT TISSUES: No acute osseous abnormality. IMPRESSION: 1. New patchy perihilar and upper lung airspace opacities, particularly in the right suprahilar region, which may reflect bilateral pneumonia, aspiration pneumonitis, pulmonary hemorrhage, or acute pulmonary edema. 2. Mild cardiomegaly. Electronically signed by: Ryan Salvage MD 11/25/2024 01:37 PM EST RP Workstation: [id number]      Medications Ordered in the ED  azithromycin  (ZITHROMAX ) 500 mg in sodium chloride  0.9 % 250 mL IVPB (500 mg Intravenous New Bag/Given 11/25/24 1832)  ipratropium-albuterol  (DUONEB) 0.5-2.5 (3) MG/3ML nebulizer solution 3 mL (3 mLs Nebulization Given 11/25/24 1240)  methylPREDNISolone  sodium succinate (SOLU-MEDROL ) 125 mg/2 mL injection 125 mg (125 mg Intravenous Given 11/25/24 1242)  ondansetron  (ZOFRAN ) injection 4 mg (4 mg Intravenous Given 11/25/24 1253)  furosemide  (LASIX ) tablet 20 mg (20 mg Oral Given 11/25/24 1325)  iohexol  (OMNIPAQUE ) 350 MG/ML injection 75 mL (75 mLs Intravenous Contrast Given 11/25/24 1350)  morphine  (PF) 4 MG/ML injection 4 mg (4 mg Intravenous Given 11/25/24 1557)  cefTRIAXone  (ROCEPHIN ) 2 g in sodium chloride  0.9 % 100 mL IVPB (0 g Intravenous Stopped 11/25/24 1805)  furosemide  (LASIX ) injection 80 mg (80 mg Intravenous Given 11/25/24 1557)  HYDROmorphone  (DILAUDID ) injection 0.5 mg (0.5 mg Intravenous Given 11/25/24 1736)  HYDROmorphone  (DILAUDID ) injection 1 mg (1 mg Intravenous Given 11/25/24 1842)                                   Medical Decision Making Patient is a 38 year old transgender female to female with a history of CHF and asthma who  presents to the ED for increasing cough, congestion, and shortness of breath for the past 3 days.  Also notes 4 episodes of bright red sputum this morning.  On exam patient is alert and uncomfortable lying on the bed.  Tachypnea is noted but no acute respiratory distress.  No acute wheezing appreciated.  Patient initially given DuoNeb treatment as well as Solu-Medrol  with minimal improvement.  Differential includes acute viral illness, bronchitis, pneumonia, esophagitis, PE, CHF exacerbation.  Labs significant for elevated BNP around 800.  Otherwise labs unremarkable including negative viral swab and negative troponins.  Lactic unremarkable.  Chest x-ray reviewed that does show new patchy perihilar and upper lung airspace opacities that is concerning for multifocal pneumonia.  CT PE was obtained as well as patient did become hypoxic at around 85% on room air while sleeping and moving.  Patient placed on 2 L O2 via [place] and has been stable.  CT  PE does redemonstrate concerns for multifocal pneumonia as well as small pleural effusion but no acute PE.  Suspect patient's pneumonia is further exacerbated by CHF exacerbation as he notes he is noncompliant with his diuretic.  As patient is requiring oxygen, we do feel admission would be beneficial for further evaluation and management of patient.  Patient started on Rocephin  and azithromycin  while in ED.  Also given 80 mg of Lasix .  Hospitalist has been consulted and are agreeable to admit patient for further evaluation.  Patient is stable while in ED and awaiting transfer.   Upon transfer arriving, patient states they are not going to be admitted to [facility] and want to go to [facility].  They state they want to be discharged.  I advised that we do not have the capacity to accommodate her request and they will have to leave AGAINST MEDICAL ADVICE.  Risks discussed including worsening pneumonia, heart failure, respiratory failure, and death.  Patient verbalized  understanding and is leaving hospital AGAINST MEDICAL ADVICE.  I advised can go to [facility] or return here at any time for admission.  Prescriptions for azithromycin  and doxycycline  provided.  Amount and/or Complexity of Data Reviewed Labs: ordered. Radiology: ordered.  Risk Prescription drug management. Decision regarding hospitalization.       Final diagnoses:  Multifocal pneumonia  Hypoxia  Acute on chronic congestive heart failure, unspecified heart failure type [facility])    ED Discharge Orders          Ordered    azithromycin  (ZITHROMAX ) 250 MG tablet  Daily        11/25/24 1858    doxycycline  (VIBRAMYCIN ) 100 MG capsule  2 times daily        11/25/24 1858               Neysa Thersia RAMAN, PA-C 11/25/24 1747    Neysa Thersia RAMAN, PA-C 11/25/24 1859    Rogelia Jerilynn RAMAN, MD 12/03/24 1223

## 2024-11-25 NOTE — ED Notes (Signed)
 PA at bedside.

## 2024-11-25 NOTE — ED Notes (Signed)
 Ambulatory to restroom

## 2024-11-25 NOTE — H&P (Incomplete)
 History and Physical    Patient: Michelle Strickland FMW:[id number] DOB: [birth year] DOA: 11/25/2024 DOS: the patient was seen and examined on 11/25/2024 PCP: Patient, No Pcp Per  Patient coming from: {Point_of_Origin:[address]}  Chief Complaint:  Chief Complaint  Patient presents with   Shortness of Breath   Chest Pain   HPI: Michelle Strickland is a 38 y.o. adult with medical history significant of ***  Review of Systems: {ROS_Text:26778} Past Medical History:  Diagnosis Date   Asthma    Bronchitis    Chronic diarrhea    COPD (chronic obstructive pulmonary disease) (HCC)    Depression    Environmental and seasonal allergies    Family history of adverse reaction to anesthesia     My sister vomited and aspirated during surgery; they gave her too much anesthesia   Fatty liver    H/O heartburn    Morbid obesity (HCC)    Osteoarthritis of right acromioclavicular joint    PTSD (post-traumatic stress disorder)    Schizophrenia (HCC)    Tendinosis of rotator cuff    impingement   Wears glasses    Past Surgical History:  Procedure Laterality Date   CHOLECYSTECTOMY     COLONOSCOPY     SHOULDER ARTHROSCOPY Right 01/31/2019   Procedure: RIGHT SHOULDER ARTHROSCOPIC DEBRIDEMENT ROTATOR CUFF; SUBACROMIAL DECOMPRESSION; DISTAL CLAVICLE EXCISION;  Surgeon: Dozier Soulier, MD;  Location: [facility] OR;  Service: Orthopedics;  Laterality: Right;   WISDOM TOOTH EXTRACTION     Social History:  reports that he has never smoked. He has never used smokeless tobacco. He reports current alcohol use. He reports that he does not currently use drugs.  Allergies  Allergen Reactions   Contrast Media [Iodinated Contrast Media] Nausea And Vomiting   Penicillins Hives and Itching   Toradol [Ketorolac Tromethamine] Nausea Only and Other (See Comments)    Migraines    Ultram [Tramadol] Other (See Comments)    Migraines    Nsaids Other (See Comments)    Unknown reaction   Watermelon [Citrullus Vulgaris]  Itching    Throat itches    Family History  Problem Relation Age of Onset   Heart disease Father    Heart disease Paternal Grandfather     Prior to Admission medications   Medication Sig Start Date End Date Taking? Authorizing Provider  DULoxetine (CYMBALTA) 20 MG capsule Take 20 mg by mouth at bedtime. 11/04/24  Yes [provider]  testosterone enanthate (DELATESTRYL) 200 MG/ML injection Inject 600 mg into the muscle once a week. 10/18/24  Yes [provider]  losartan  (COZAAR ) 25 MG tablet Take 1/2 (one-half) tablet by mouth once daily 08/15/24   Lonni Slain, MD  metoprolol  succinate (TOPROL -XL) 25 MG 24 hr tablet Take 0.5 tablets (12.5 mg total) by mouth daily. 11/19/24   Lonni Slain, MD  testosterone cypionate (DEPOTESTOSTERONE CYPIONATE) 200 MG/ML injection Inject 200 mg into the muscle every Thursday.    [provider]  tirzepatide  (ZEPBOUND ) 10 MG/0.5ML Pen Inject 10 mg into the skin once a week. 01/14/25   Lonni Slain, MD  tirzepatide  (ZEPBOUND ) 12.5 MG/0.5ML Pen Inject 12.5 mg into the skin once a week. 02/14/25   Lonni Slain, MD  tirzepatide  (ZEPBOUND ) 15 MG/0.5ML Pen Inject 15 mg into the skin once a week. 03/14/25   Lonni Slain, MD  tirzepatide  (ZEPBOUND ) 5 MG/0.5ML Pen Inject 5 mg into the skin once a week. 11/14/24   Lonni Slain, MD  tirzepatide  (ZEPBOUND ) 7.5 MG/0.5ML Pen Inject 7.5 mg into the skin  once a week. 12/14/24   Lonni Slain, MD  torsemide  (DEMADEX ) 20 MG tablet Take 1 tablet (20 mg total) by mouth daily as needed (weight gain or swelling). 10/17/23 10/11/24  Lonni Slain, MD    Physical Exam: Vitals:   11/25/24 1500 11/25/24 1530 11/25/24 1615 11/25/24 1739  BP: 109/70 (!) 124/91    Pulse: 88 80 76   Resp: (!) 21 18 (!) 22   Temp:    97.8 F (36.6 C)  TempSrc:    Oral  SpO2: 98% 99% 92%    *** Data Reviewed: {Tip this will not be part of the  note when signed- Document your independent interpretation of telemetry tracing, EKG, lab, Radiology test or any other diagnostic tests. Add any new diagnostic test ordered today. (Optional):26781} {Results:26384}  Assessment and Plan: No notes have been filed under this hospital service. Service: Hospitalist     Advance Care Planning:   Code Status: Prior ***  Consults: ***  Family Communication: ***  Severity of Illness: {Observation/Inpatient:21159}  Author: Burnard Michelle Cunning, DO 11/25/2024 5:48 PM  For on call review [email or web address].

## 2024-11-26 ENCOUNTER — Telehealth (HOSPITAL_BASED_OUTPATIENT_CLINIC_OR_DEPARTMENT_OTHER): Payer: Self-pay | Admitting: Cardiology

## 2024-11-26 DIAGNOSIS — I5042 Chronic combined systolic (congestive) and diastolic (congestive) heart failure: Secondary | ICD-10-CM

## 2024-11-26 DIAGNOSIS — R0683 Snoring: Secondary | ICD-10-CM

## 2024-11-26 DIAGNOSIS — E66813 Obesity, class 3: Secondary | ICD-10-CM

## 2024-11-26 DIAGNOSIS — R400 Somnolence: Secondary | ICD-10-CM

## 2024-11-26 DIAGNOSIS — G4733 Obstructive sleep apnea (adult) (pediatric): Secondary | ICD-10-CM

## 2024-11-26 NOTE — Telephone Encounter (Signed)
 Called, went to voicemail Mychart message sent to patient will forward to sleep team for review

## 2024-11-26 NOTE — Telephone Encounter (Signed)
  Patient has now gotten insurance and would like to have Dr Lonni order her CPAP again

## 2024-11-28 NOTE — Telephone Encounter (Signed)
**Note De-Identified Leron Stoffers Obfuscation** I s/w the pt and he confirmed that his new insurance is through [employer]. He is aware that I am working on the prior authorization for his CPAP Titration and that I will contact him once it has been completed and we have a determination.  He verbalized understanding and thanked me for my call.  I did start a CPAP Titration through the [facility] provider portal and it is currently pending: Auth #: [id number]

## 2024-11-29 ENCOUNTER — Other Ambulatory Visit (HOSPITAL_BASED_OUTPATIENT_CLINIC_OR_DEPARTMENT_OTHER): Payer: Self-pay

## 2024-11-29 ENCOUNTER — Encounter (HOSPITAL_BASED_OUTPATIENT_CLINIC_OR_DEPARTMENT_OTHER): Payer: Self-pay | Admitting: *Deleted

## 2024-11-29 ENCOUNTER — Encounter (HOSPITAL_BASED_OUTPATIENT_CLINIC_OR_DEPARTMENT_OTHER): Payer: Self-pay | Admitting: Cardiology

## 2024-11-29 ENCOUNTER — Ambulatory Visit (HOSPITAL_BASED_OUTPATIENT_CLINIC_OR_DEPARTMENT_OTHER): Payer: MEDICAID | Admitting: Cardiology

## 2024-11-29 VITALS — BP 136/94 | HR 88 | Ht 66.0 in | Wt 255.2 lb

## 2024-11-29 DIAGNOSIS — I428 Other cardiomyopathies: Secondary | ICD-10-CM | POA: Diagnosis not present

## 2024-11-29 DIAGNOSIS — G4733 Obstructive sleep apnea (adult) (pediatric): Secondary | ICD-10-CM | POA: Diagnosis not present

## 2024-11-29 DIAGNOSIS — J188 Other pneumonia, unspecified organism: Secondary | ICD-10-CM

## 2024-11-29 DIAGNOSIS — I5020 Unspecified systolic (congestive) heart failure: Secondary | ICD-10-CM | POA: Diagnosis not present

## 2024-11-29 MED ORDER — METOPROLOL SUCCINATE ER 25 MG PO TB24
12.5000 mg | ORAL_TABLET | Freq: Every day | ORAL | 3 refills | Status: AC
  Filled 2024-11-29: qty 45, 90d supply, fill #0

## 2024-11-29 MED ORDER — LOSARTAN POTASSIUM 25 MG PO TABS
12.5000 mg | ORAL_TABLET | Freq: Every day | ORAL | 3 refills | Status: AC
  Filled 2024-11-29: qty 45, 90d supply, fill #0

## 2024-11-29 NOTE — Progress Notes (Signed)
 Cardiology Office Note:  .    Date:  11/29/2024  ID:  Michelle Strickland, DOB [birth year], MRN [id number] PCP: Patient, No Pcp Per  [facility] Providers Cardiologist:  Shelda Bruckner, MD     History of Present Illness: Michelle Strickland is a 38 y.o. adult with a hx of nonischemic cardiomyopathy, chronic systolic and diastolic heart failure, OSA, obesity, who is seen for follow-up.    CV history: 07/31/2022 admitted with chest pain. Troponin levels were abnormal but flat trend. EKG nonischemic. Echocardiogram showed EF of 45% with global hypokinesis. D-dimer was noted to be normal. LDL 104. Coronary CTA which showed no significant disease. Started on beta blocker and ARB. Re-admitted 08/06/22 with worsening dyspnea and pleuritic chest pain. Diuresed, oral lasix  added. Presented to ER 08/17/22 with shortness of breath and chest pain. Workup unremarkable but had episode of HR dropping to 30s, monitored on telemetry, no significant abnormalities.  OSA diagnosed 03/19/24, followed by Dr. Shlomo.   Today:  Since our last visit, seen in ER 06/25/24 for shortness of breath and palpitations, did note use of meth that AM. Prsented to ER recently 11/25/24, with cough, congestion, shortness of breath with some blood tinged sputum. Respiratory panel negative, did note to have O2 sat drop to 85% with movement, CTPE negative for PE but was concerning for multifocal pneumonia. Also noted to have small pleural effusion. Given nebulizers, solumedrol, lasix , and rocephin /azithromycin . Recommended for admission, patient declined and left AMA. Was given prescriptions for doxycycline  and azithryomycin.  No fevers, cough is improved, breathing slowly getting better. Has sharp chest pain on breathing. Reviewed signs/symptoms that need return to the ER.   Had not required torsemide  until the pneumonia hit. Doing well on metoprolol  and losartan .   ROS:    Studies Reviewed: Michelle         Physical Exam:     VS:  BP (!) 136/94   Pulse 88   Ht 5' 6 (1.676 m)   Wt 255 lb 3.2 oz (115.8 kg)   SpO2 97%   BMI 41.19 kg/m    Wt Readings from Last 3 Encounters:  11/29/24 255 lb 3.2 oz (115.8 kg)  06/25/24 243 lb (110.2 kg)  10/17/23 281 lb (127.5 kg)    GEN: Well nourished, well developed in no acute distress HEENT: Normal, moist mucous membranes NECK: No JVD CARDIAC: regular rhythm, normal S1 and S2, no rubs or gallops. No murmur. VASCULAR: Radial and DP pulses 2+ bilaterally. No carotid bruits RESPIRATORY:  Clear to auscultation without rales, wheezing or rhonchi  ABDOMEN: Soft, non-tender, non-distended MUSCULOSKELETAL:  Ambulates independently SKIN: Warm and dry, no edema NEUROLOGIC:  Alert and oriented x 3. No focal neuro deficits noted. PSYCHIATRIC:  Normal affect   ASSESSMENT AND PLAN: .    Nonischemic cardiomyopathy, with heart failure with recovered EF as of 10/2023 Obesity, BMI 47->42->450>41 -acutely ill, difficult to assess NYHA class given this -Echo 10/2023 with recovered EF of 55-60%. Moderate apical LVH. No significant valve disease -continue metoprolol , losartan , refilled today -rarely requires torsemide   Multifocal pneumonia -reviewed ER evaluation 4 days ago, patient declined admission -reviewed pleuritic pain, when to go back ER -lungs sound clear on exam, but we did discuss that symptoms can linger for weeks  OSA Obesity as above -sleep study 02/2024 reviewed, pending CPAP titration -now on Zepbound    Cardiac risk counseling and prevention recommendations: -recommend heart healthy/Mediterranean diet, with whole grains, fruits, vegetable, fish, lean meats, nuts, and  olive oil. Limit salt. -recommend moderate walking, 3-5 times/week for 30-50 minutes each session. Aim for at least 150 minutes/week. Goal should be pace of 3 miles/hours, or walking 1.5 miles in 30 minutes -recommend avoidance of tobacco products. Avoid excess alcohol.  Dispo: Follow-up in 6  months, or sooner as needed.  Signed, Shelda Bruckner, MD

## 2024-11-29 NOTE — Patient Instructions (Signed)
 Medication Instructions:  No changes today *If you need a refill on your cardiac medications before your next appointment, please call your pharmacy*  Lab Work: none  Testing/Procedures: none  Follow-Up: At [facility], you and your health needs are our priority.  As part of our continuing mission to provide you with exceptional heart care, our providers are all part of one team.  This team includes your primary Cardiologist (physician) and Advanced Practice Providers or APPs (Physician Assistants and Nurse Practitioners) who all work together to provide you with the care you need, when you need it.  Your next appointment:   6 month(s)  Provider:   Shelda Bruckner, MD, Rosaline Bane, NP, or Reche Finder, NP    Other Instructions Work note provided today

## 2024-12-01 LAB — CULTURE, BLOOD (ROUTINE X 2)
Culture: NO GROWTH
Culture: NO GROWTH
Special Requests: ADEQUATE
Special Requests: ADEQUATE

## 2024-12-02 NOTE — Telephone Encounter (Signed)
**Note De-Identified Michelle Strickland Obfuscation** Letter received from [name] fax stating that they have approved coverage of the pts CPAP Titration from 12/30/2024-01/30/2025. Authorization #: [id number]  I have transferred the order to the sleep lab.  I called the pt and made him aware of this approval and I gave him the Sleep Lab's phone number so he can call them to schedule his CPAP Titration. He is aware that his valid dates to have the study done per [employer] is 12/30/24-2/04-2025.  He verbalized understanding and thanked me or my call.

## 2024-12-05 ENCOUNTER — Other Ambulatory Visit: Payer: MEDICAID

## 2024-12-17 ENCOUNTER — Other Ambulatory Visit: Payer: Self-pay | Admitting: Cardiology

## 2024-12-17 ENCOUNTER — Other Ambulatory Visit (HOSPITAL_BASED_OUTPATIENT_CLINIC_OR_DEPARTMENT_OTHER): Payer: Self-pay

## 2024-12-17 MED ORDER — ZEPBOUND 7.5 MG/0.5ML ~~LOC~~ SOAJ
7.5000 mg | SUBCUTANEOUS | 0 refills | Status: AC
  Filled 2024-12-17: qty 2, 28d supply, fill #0

## 2024-12-21 ENCOUNTER — Other Ambulatory Visit (HOSPITAL_BASED_OUTPATIENT_CLINIC_OR_DEPARTMENT_OTHER): Payer: Self-pay

## 2024-12-31 DIAGNOSIS — Z006 Encounter for examination for normal comparison and control in clinical research program: Secondary | ICD-10-CM

## 2025-01-17 ENCOUNTER — Ambulatory Visit (HOSPITAL_BASED_OUTPATIENT_CLINIC_OR_DEPARTMENT_OTHER): Payer: MEDICAID | Admitting: Cardiology
